# Patient Record
Sex: Female | Born: 1935 | Race: White | Hispanic: No | Smoking: Never smoker
Health system: Southern US, Community
[De-identification: ages and names within clinical notes are randomized; demographics above are authoritative.]

## PROBLEM LIST (undated history)

## (undated) DIAGNOSIS — M1990 Unspecified osteoarthritis, unspecified site: Secondary | ICD-10-CM

## (undated) DIAGNOSIS — I10 Essential (primary) hypertension: Secondary | ICD-10-CM

## (undated) DIAGNOSIS — E039 Hypothyroidism, unspecified: Secondary | ICD-10-CM

## (undated) DIAGNOSIS — E7800 Pure hypercholesterolemia, unspecified: Secondary | ICD-10-CM

## (undated) DIAGNOSIS — C50919 Malignant neoplasm of unspecified site of unspecified female breast: Secondary | ICD-10-CM

## (undated) DIAGNOSIS — I4891 Unspecified atrial fibrillation: Secondary | ICD-10-CM

## (undated) HISTORY — PX: OTHER SURGICAL HISTORY: SHX169

## (undated) HISTORY — PX: BREAST SURGERY: SHX581

## (undated) HISTORY — PX: ABDOMINAL HYSTERECTOMY: SHX81

---

## 1980-07-14 DIAGNOSIS — C50919 Malignant neoplasm of unspecified site of unspecified female breast: Secondary | ICD-10-CM

## 1980-07-14 HISTORY — PX: OTHER SURGICAL HISTORY: SHX169

## 1980-07-14 HISTORY — DX: Malignant neoplasm of unspecified site of unspecified female breast: C50.919

## 2012-06-06 ENCOUNTER — Observation Stay (HOSPITAL_COMMUNITY)
Admission: EM | Admit: 2012-06-06 | Discharge: 2012-06-07 | Disposition: A | Discharge status: Home / Self Care | Payer: Medicare Other | Attending: Internal Medicine | Admitting: Internal Medicine

## 2012-06-06 ENCOUNTER — Emergency Department (HOSPITAL_COMMUNITY): Payer: Medicare Other

## 2012-06-06 ENCOUNTER — Encounter (HOSPITAL_COMMUNITY): Payer: Self-pay

## 2012-06-06 DIAGNOSIS — R05 Cough: Secondary | ICD-10-CM | POA: Insufficient documentation

## 2012-06-06 DIAGNOSIS — J189 Pneumonia, unspecified organism: Principal | ICD-10-CM | POA: Diagnosis present

## 2012-06-06 DIAGNOSIS — E860 Dehydration: Secondary | ICD-10-CM | POA: Diagnosis present

## 2012-06-06 DIAGNOSIS — Z23 Encounter for immunization: Secondary | ICD-10-CM | POA: Insufficient documentation

## 2012-06-06 DIAGNOSIS — E039 Hypothyroidism, unspecified: Secondary | ICD-10-CM | POA: Diagnosis present

## 2012-06-06 DIAGNOSIS — D649 Anemia, unspecified: Secondary | ICD-10-CM | POA: Diagnosis present

## 2012-06-06 DIAGNOSIS — R0602 Shortness of breath: Secondary | ICD-10-CM | POA: Insufficient documentation

## 2012-06-06 DIAGNOSIS — R079 Chest pain, unspecified: Secondary | ICD-10-CM | POA: Insufficient documentation

## 2012-06-06 DIAGNOSIS — I10 Essential (primary) hypertension: Secondary | ICD-10-CM | POA: Diagnosis not present

## 2012-06-06 DIAGNOSIS — D509 Iron deficiency anemia, unspecified: Secondary | ICD-10-CM | POA: Insufficient documentation

## 2012-06-06 DIAGNOSIS — J9801 Acute bronchospasm: Secondary | ICD-10-CM | POA: Insufficient documentation

## 2012-06-06 DIAGNOSIS — R059 Cough, unspecified: Secondary | ICD-10-CM | POA: Insufficient documentation

## 2012-06-06 DIAGNOSIS — R509 Fever, unspecified: Secondary | ICD-10-CM | POA: Insufficient documentation

## 2012-06-06 HISTORY — DX: Essential (primary) hypertension: I10

## 2012-06-06 HISTORY — DX: Hypothyroidism, unspecified: E03.9

## 2012-06-06 HISTORY — DX: Pure hypercholesterolemia, unspecified: E78.00

## 2012-06-06 HISTORY — DX: Unspecified osteoarthritis, unspecified site: M19.90

## 2012-06-06 HISTORY — DX: Malignant neoplasm of unspecified site of unspecified female breast: C50.919

## 2012-06-06 LAB — CBC WITH DIFFERENTIAL/PLATELET
Basophils Absolute: 0 10*3/uL (ref 0.0–0.1)
Eosinophils Relative: 0 % (ref 0–5)
Lymphocytes Relative: 10 % — ABNORMAL LOW (ref 12–46)
Lymphs Abs: 0.6 10*3/uL — ABNORMAL LOW (ref 0.7–4.0)
MCV: 90.9 fL (ref 78.0–100.0)
Neutrophils Relative %: 79 % — ABNORMAL HIGH (ref 43–77)
Platelets: 166 10*3/uL (ref 150–400)
RBC: 3.08 MIL/uL — ABNORMAL LOW (ref 3.87–5.11)
RDW: 15 % (ref 11.5–15.5)
WBC: 5.7 10*3/uL (ref 4.0–10.5)

## 2012-06-06 LAB — BASIC METABOLIC PANEL
Calcium: 9.5 mg/dL (ref 8.4–10.5)
Creatinine, Ser: 0.81 mg/dL (ref 0.50–1.10)
GFR calc Af Amer: 74 mL/min — ABNORMAL LOW (ref >90)
GFR calc non Af Amer: 64 mL/min — ABNORMAL LOW (ref >90)
Sodium: 134 mEq/L — ABNORMAL LOW (ref 135–145)

## 2012-06-06 LAB — MRSA PCR SCREENING: MRSA by PCR: NEGATIVE

## 2012-06-06 LAB — FERRITIN: Ferritin: 34 ng/mL (ref 10–291)

## 2012-06-06 LAB — IRON AND TIBC: TIBC: 291 ug/dL (ref 250–470)

## 2012-06-06 MED ORDER — BENZONATATE 100 MG PO CAPS
100.0000 mg | ORAL_CAPSULE | Freq: Three times a day (TID) | ORAL | Status: DC | PRN
  Administered 2012-06-07: 100 mg via ORAL
  Filled 2012-06-06: qty 1

## 2012-06-06 MED ORDER — ACETAMINOPHEN 325 MG PO TABS: 650.0000 mg | ORAL_TABLET | Freq: Four times a day (QID) | ORAL | Status: DC | PRN

## 2012-06-06 MED ORDER — SODIUM CHLORIDE 0.9 % IV BOLUS (SEPSIS): 1000.0000 mL | Freq: Once | INTRAVENOUS | Status: DC

## 2012-06-06 MED ORDER — SODIUM CHLORIDE 0.9 % IV BOLUS (SEPSIS)
500.0000 mL | Freq: Once | INTRAVENOUS | Status: AC
  Administered 2012-06-06: 500 mL via INTRAVENOUS

## 2012-06-06 MED ORDER — ACETAMINOPHEN 500 MG PO TABS
1000.0000 mg | ORAL_TABLET | Freq: Every day | ORAL | Status: DC
  Administered 2012-06-06: 1000 mg via ORAL
  Filled 2012-06-06: qty 2

## 2012-06-06 MED ORDER — DEXTROSE 5 % IV SOLN
1.0000 g | Freq: Once | INTRAVENOUS | Status: AC
  Administered 2012-06-06: 1 g via INTRAVENOUS
  Filled 2012-06-06: qty 10

## 2012-06-06 MED ORDER — LEVALBUTEROL HCL 0.63 MG/3ML IN NEBU: 0.6300 mg | INHALATION_SOLUTION | RESPIRATORY_TRACT | Status: DC | PRN

## 2012-06-06 MED ORDER — ACETAMINOPHEN 650 MG RE SUPP: 650.0000 mg | Freq: Four times a day (QID) | RECTAL | Status: DC | PRN

## 2012-06-06 MED ORDER — HYDROCODONE-ACETAMINOPHEN 5-325 MG PO TABS
1.0000 | ORAL_TABLET | ORAL | Status: DC | PRN
  Administered 2012-06-07: 1 via ORAL
  Filled 2012-06-06: qty 1

## 2012-06-06 MED ORDER — ONDANSETRON HCL 4 MG/2ML IJ SOLN: 4.0000 mg | Freq: Four times a day (QID) | INTRAMUSCULAR | Status: DC | PRN

## 2012-06-06 MED ORDER — POTASSIUM CHLORIDE CRYS ER 20 MEQ PO TBCR
20.0000 meq | EXTENDED_RELEASE_TABLET | Freq: Every day | ORAL | Status: AC
  Administered 2012-06-06: 20 meq via ORAL
  Filled 2012-06-06: qty 1

## 2012-06-06 MED ORDER — POTASSIUM CHLORIDE IN NACL 20-0.9 MEQ/L-% IV SOLN
INTRAVENOUS | Status: DC
  Administered 2012-06-06 – 2012-06-07 (×2): via INTRAVENOUS

## 2012-06-06 MED ORDER — GUAIFENESIN-DM 100-10 MG/5ML PO SYRP
5.0000 mL | ORAL_SOLUTION | ORAL | Status: DC | PRN
  Administered 2012-06-07: 5 mL via ORAL
  Filled 2012-06-06: qty 5

## 2012-06-06 MED ORDER — LEVOFLOXACIN IN D5W 500 MG/100ML IV SOLN
500.0000 mg | INTRAVENOUS | Status: DC
  Administered 2012-06-07: 500 mg via INTRAVENOUS
  Filled 2012-06-06 (×3): qty 100

## 2012-06-06 MED ORDER — ONDANSETRON HCL 4 MG PO TABS: 4.0000 mg | ORAL_TABLET | Freq: Four times a day (QID) | ORAL | Status: DC | PRN

## 2012-06-06 MED ORDER — DIPHENHYDRAMINE HCL 25 MG PO CAPS
25.0000 mg | ORAL_CAPSULE | Freq: Every day | ORAL | Status: DC
  Administered 2012-06-06: 25 mg via ORAL
  Filled 2012-06-06: qty 1

## 2012-06-06 MED ORDER — AZITHROMYCIN 500 MG IV SOLR
500.0000 mg | INTRAVENOUS | Status: DC
  Administered 2012-06-06: 500 mg via INTRAVENOUS
  Filled 2012-06-06: qty 500

## 2012-06-06 MED ORDER — PNEUMOCOCCAL VAC POLYVALENT 25 MCG/0.5ML IJ INJ
0.5000 mL | INJECTION | INTRAMUSCULAR | Status: AC
  Administered 2012-06-07: 0.5 mL via INTRAMUSCULAR
  Filled 2012-06-06: qty 0.5

## 2012-06-06 NOTE — ED Provider Notes (Signed)
History   This chart was scribed for Doug Sou, MD by Charolett Bumpers, ER Scribe. The patient was seen in room APA12/APA12. Patient's care was started at 1345.   CSN: [id number]  Arrival date & time 06/06/12  1321   First MD Initiated Contact with Patient 06/06/12 1345      Chief Complaint  Patient presents with  . Shortness of Breath   Raven Moore is a 78 y.o. female who presents to the Emergency Department complaining of SOB with an associated cough, chest pain and fever that started 2 days ago. She states her chest pain is worse with deep breaths. She reports she had a fever of 101 two days ago and a fever of 100 today. Temperature here in ED is 98.4. She denies any abdominal pain, diarrhea or vomiting. She states that she has a surgical h/o 2 hip replacements, hysterectomy, breast removal. She denies smoking or alcohol use. She reports her last hospitalization was around 2002.   Patient is a 78 y.o. female presenting with shortness of breath. The history is provided by the patient. No language interpreter was used.  Shortness of Breath  The current episode started 2 days ago. The onset was gradual. The problem has been unchanged. The problem is mild. Nothing relieves the symptoms. Nothing aggravates the symptoms. Associated symptoms include a fever, cough and shortness of breath.   PCP: Dr. Jarold Motto in [place].  Past Medical History  Diagnosis Date  . Hypertension   . Thyroid disease   . High cholesterol     Past Surgical History  Procedure Date  . Abdominal hysterectomy   . Hip pinning   . Mastectomy     No family history on file.  History  Substance Use Topics  . Smoking status: Not on file  . Smokeless tobacco: Not on file  . Alcohol Use: No    OB History    Grav Para Term Preterm Abortions TAB SAB Ect Mult Living                  Review of Systems  Constitutional: Positive for fever.  HENT: Negative.   Respiratory: Positive for cough and  shortness of breath.        Pleuritic chest pain  Cardiovascular: Negative.   Gastrointestinal: Negative.   Musculoskeletal: Negative.   Skin: Negative.   Neurological: Negative.   Hematological: Negative.   Psychiatric/Behavioral: Negative.   All other systems reviewed and are negative.    Allergies  Review of patient's allergies indicates no known allergies.  Home Medications  No current outpatient prescriptions on file.  BP 113/77  Pulse 87  Temp 98.4 F (36.9 C) (Oral)  Resp 18  Ht 5\' 4"  (1.626 m)  Wt 120 lb (54.432 kg)  BMI 20.60 kg/m2  SpO2 94%  LMP 06/06/2012  Physical Exam  Nursing note and vitals reviewed. Constitutional: She is oriented to person, place, and time. She appears well-developed and well-nourished.  HENT:  Head: Normocephalic and atraumatic.  Eyes: Conjunctivae normal are normal. Pupils are equal, round, and reactive to light.  Neck: Neck supple. No tracheal deviation present. No thyromegaly present.  Cardiovascular: Normal rate and regular rhythm.   No murmur heard. Pulmonary/Chest: Effort normal. No respiratory distress. She has rales.       Diffuse scant rales. Speaks in paragraphs  Abdominal: Soft. Bowel sounds are normal. She exhibits no distension. There is no tenderness.  Musculoskeletal: Normal range of motion. She exhibits no edema and no  tenderness.  Neurological: She is alert and oriented to person, place, and time. Coordination normal.  Skin: Skin is warm and dry. No rash noted.  Psychiatric: She has a normal mood and affect.    ED Course  Procedures (including critical care time)  DIAGNOSTIC STUDIES: Oxygen Saturation is 94% on room air, adequate by my interpretation.    COORDINATION OF CARE:  13:55-Discussed planned course of treatment with the patient including a chest x-ray, who is agreeable at this time.   Results for orders placed during the hospital encounter of 06/06/12  BASIC METABOLIC PANEL      Component Value  Range   Sodium 134 (*) 135 - 145 mEq/L   Potassium 3.5  3.5 - 5.1 mEq/L   Chloride 100  96 - 112 mEq/L   CO2 24  19 - 32 mEq/L   Glucose, Bld 105 (*) 70 - 99 mg/dL   BUN 28 (*) 6 - 23 mg/dL   Creatinine, Ser 1.61  0.50 - 1.10 mg/dL   Calcium 9.5  8.4 - 09.6 mg/dL   GFR calc non Af Amer 64 (*) >90 mL/min   GFR calc Af Amer 74 (*) >90 mL/min  CBC WITH DIFFERENTIAL      Component Value Range   WBC 5.7  4.0 - 10.5 K/uL   RBC 3.08 (*) 3.87 - 5.11 MIL/uL   Hemoglobin 9.3 (*) 12.0 - 15.0 g/dL   HCT 04.5 (*) 40.9 - 81.1 %   MCV 90.9  78.0 - 100.0 fL   MCH 30.2  26.0 - 34.0 pg   MCHC 33.2  30.0 - 36.0 g/dL   RDW 91.4  78.2 - 95.6 %   Platelets 166  150 - 400 K/uL   Neutrophils Relative 79 (*) 43 - 77 %   Neutro Abs 4.5  1.7 - 7.7 K/uL   Lymphocytes Relative 10 (*) 12 - 46 %   Lymphs Abs 0.6 (*) 0.7 - 4.0 K/uL   Monocytes Relative 11  3 - 12 %   Monocytes Absolute 0.6  0.1 - 1.0 K/uL   Eosinophils Relative 0  0 - 5 %   Eosinophils Absolute 0.0  0.0 - 0.7 K/uL   Basophils Relative 0  0 - 1 %   Basophils Absolute 0.0  0.0 - 0.1 K/uL    Dg Chest 2 View  06/06/2012  *RADIOLOGY REPORT*  Clinical Data: Shortness of breath and wheezing.  CHEST - 2 VIEW  Comparison: None.  Findings: There is an infiltrate in the left lower lobe with associated small left pleural effusion.  Underlying COPD present. No pulmonary edema.  Heart size is normal.  IMPRESSION: Left lower lobe infiltrate with associated small left pleural effusion.   Original Report Authenticated By: Irish Lack, M.D.      No diagnosis found.  X-ray reviewed by me MDM  Spoke with Dr. Sherrie Mustache plan 23 hour observation MedSurg floor Diagnosis #1 community-acquired pneumonia #2 anemia     I personally performed the services described in this documentation, which was scribed in my presence. The recorded information has been reviewed and is accurate.      Doug Sou, MD 06/06/12 1535

## 2012-06-06 NOTE — ED Notes (Signed)
Complain of being SOB. Denies other symptoms

## 2012-06-06 NOTE — H&P (Signed)
[facility] History and Physical  Raven Goody Dunaway BJY:[id number] DOB: 04/19/1924 DOA: 06/06/2012  Referring physician: Dr. Rennis Chris PCP: Nurse practitioner Ms. Jarold Motto    Chief Complaint: Shortness of breath  HPI: Raven Moore is a 78 y.o. female with a history significant for hypertension, hypothyroidism, and hyperlipidemia, who presents to the emergency department today with a chief complaint of shortness of breath. She started feeling "lousy" 2 weeks ago. Over the past couple days, she developed a fever of 101.21F, shortness of breath with little activity, occasional wheezing, and a dry hacking cough. She also has some central chest discomfort with deep inspiration and with coughing. She denies nausea, vomiting, diarrhea, abdominal pain, dizziness, swelling in her legs, or pain with urination.  In the emergency department, she is afebrile and hemodynamically stable. She is oxygenating 94% on room air. Her chest x-ray reveals left lower lobe infiltrate and small pleural effusion. Her lab data are significant for a hemoglobin of 9.3, sodium of 134, and BUN of 28. She is being admitted for evaluation and management.  Review of Systems: As above in history present illness. In addition, she has chronic low back pain. She denies black tarry stools or bright red blood per rectum. Otherwise, review of systems is negative.  Past Medical History  Diagnosis Date  . Hypertension   . High cholesterol   . Hypothyroidism 06/06/2012  . Breast cancer 1982    On the right.  . DJD (degenerative joint disease)    Past Surgical History  Procedure Date  . Abdominal hysterectomy     For fibroid tumors.  . Bilateral total hip replacements   . Right-sided mastectomy 1982   Social History: She is widowed. She has one daughter, Lurena Joiner. Lurena Joiner lives with her. She is retired. She denies tobacco, alcohol, and illicit drug use. She still drives.  Allergies  Allergen Reactions  . Novocain (Procaine  Hcl)    Family history: Her mother died of breast cancer. Etiology of her father's death is unknown.  Prior to Admission medications   Not on File  The patient does not recall all the names of her medications neither does she recall the doses. The medications will be obtained and reviewed tomorrow morning.    Physical Exam: Filed Vitals:   06/06/12 1333 06/06/12 1339  BP: 113/77   Pulse: 87   Temp: 98.4 F (36.9 C)   TempSrc: Oral   Resp: 18   Height:  5\' 4"  (1.626 m)  Weight: 54.432 kg (120 lb) 54.432 kg (120 lb)  SpO2: 94%      General:  Pleasant, alert 78 year old Caucasian woman sitting up in bed, in no acute distress.  Eyes: Pupils are equal, round, and reactive to light. Extraocular movements are intact. Conjunctivae are clear. Sclerae are white.  ENT: Oropharynx reveals mildly dry mucous membranes. No posterior exudates or erythema.  Neck: Supple, no adenopathy, no thyromegaly, no JVD.  Cardiovascular: S1, S2, with no murmurs rubs or gallops.  Respiratory: Rare crackles left lower base, otherwise clear. Breathing nonlabored.  Abdomen: Positive bowel sounds, soft, nontender, nondistended.  Skin: Fair turgor. Small excoriation on the right lateral leg with a small amount of erythema, no drainage, no tenderness (from accidental scraping of leg on the car door).  Musculoskeletal: No acute hot red joints. Pedal pulses palpable. No pedal edema.  Psychiatric: Pleasant affect. Alert. Oriented. Cooperative.  Neurologic: Cranial nerves II through XII are intact. Strength is 5 over 5 throughout. Sensation is intact.  Labs on Admission:  Basic Metabolic Panel:  Lab 06/06/12 2130  NA 134*  K 3.5  CL 100  CO2 24  GLUCOSE 105*  BUN 28*  CREATININE 0.81  CALCIUM 9.5  MG --  PHOS --   Liver Function Tests: No results found for this basename: AST:5,ALT:5,ALKPHOS:5,BILITOT:5,PROT:5,ALBUMIN:5 in the last 168 hours No results found for this basename:  LIPASE:5,AMYLASE:5 in the last 168 hours No results found for this basename: AMMONIA:5 in the last 168 hours CBC:  Lab 06/06/12 1353  WBC 5.7  NEUTROABS 4.5  HGB 9.3*  HCT 28.0*  MCV 90.9  PLT 166   Cardiac Enzymes: No results found for this basename: CKTOTAL:5,CKMB:5,CKMBINDEX:5,TROPONINI:5 in the last 168 hours  BNP (last 3 results) No results found for this basename: PROBNP:3 in the last 8760 hours CBG: No results found for this basename: GLUCAP:5 in the last 168 hours  Radiological Exams on Admission: Dg Chest 2 View  06/06/2012  *RADIOLOGY REPORT*  Clinical Data: Shortness of breath and wheezing.  CHEST - 2 VIEW  Comparison: None.  Findings: There is an infiltrate in the left lower lobe with associated small left pleural effusion.  Underlying COPD present. No pulmonary edema.  Heart size is normal.  IMPRESSION: Left lower lobe infiltrate with associated small left pleural effusion.   Original Report Authenticated By: Irish Lack, M.D.       Assessment/Plan Principal Problem:  *CAP (community acquired pneumonia) Active Problems:  Anemia  Dehydration  HTN (hypertension)  Hypothyroidism   1. The patient is an 78 year old woman who presents with symptomatology of community acquired pneumonia. She is afebrile here, but she was febrile at home. She has had generalized malaise, cough, fever, shortness of breath, and occasional wheezing. She does not appear to be in extremis, but it is reasonable to admit her for 24-hour observation for intravenous antibiotics. She is also noted to be mildly dehydrated with an elevated BUN and slightly low serum sodium. She is also noted to be anemic. She was unaware of this. She has no history of black tarry stools or bright red blood per rectum. She had a colonoscopy approximately 13 years ago and was  told that there was nothing seriously found.      Plan: 1. We'll admit the patient for 24-hour observation. 2. The patient received  Rocephin and azithromycin in the emergency department. We'll continue antibiotic treatment with intravenous Levaquin starting tomorrow. 3. Gentle IV fluid hydration. 4. Symptomatic treatment with as needed Tessalon Perles, as needed Xopenex nebulizer, and as needed Robitussin. 5. We'll order a few anemia studies.       Code Status: Full code. Family Communication: Discussed with daughter. Disposition Plan: Probable discharge tomorrow.  Time spent: One hour.  Kolbie Clarkston [facility] Pager: [phone].  If 7PM-7AM, please contact night-coverage [email or web address] Password [email or web address] 06/06/2012, 4:16 PM

## 2012-06-06 NOTE — ED Notes (Signed)
Pt placed on 2L of [place] O2

## 2012-06-06 NOTE — ED Notes (Signed)
Dr. Sherrie Mustache in room to assess pt

## 2012-06-07 ENCOUNTER — Encounter (HOSPITAL_COMMUNITY): Payer: Self-pay | Admitting: *Deleted

## 2012-06-07 LAB — COMPREHENSIVE METABOLIC PANEL
ALT: 22 U/L (ref 0–35)
Alkaline Phosphatase: 38 U/L — ABNORMAL LOW (ref 39–117)
CO2: 27 mEq/L (ref 19–32)
Chloride: 106 mEq/L (ref 96–112)
GFR calc Af Amer: 69 mL/min — ABNORMAL LOW (ref >90)
Glucose, Bld: 83 mg/dL (ref 70–99)
Potassium: 4.3 mEq/L (ref 3.5–5.1)
Sodium: 139 mEq/L (ref 135–145)
Total Bilirubin: 0.3 mg/dL (ref 0.3–1.2)
Total Protein: 5.5 g/dL — ABNORMAL LOW (ref 6.0–8.3)

## 2012-06-07 LAB — CBC
HCT: 27.1 % — ABNORMAL LOW (ref 36.0–46.0)
MCHC: 32.5 g/dL (ref 30.0–36.0)
Platelets: 175 10*3/uL (ref 150–400)
RDW: 15.2 % (ref 11.5–15.5)

## 2012-06-07 MED ORDER — LEVALBUTEROL TARTRATE 45 MCG/ACT IN AERO
1.0000 | INHALATION_SPRAY | Freq: Four times a day (QID) | RESPIRATORY_TRACT | Status: DC
  Administered 2012-06-07: 1 via RESPIRATORY_TRACT
  Filled 2012-06-07: qty 15

## 2012-06-07 MED ORDER — BENZONATATE 100 MG PO CAPS: 100.0000 mg | ORAL_CAPSULE | Freq: Three times a day (TID) | ORAL | Status: DC | PRN

## 2012-06-07 MED ORDER — LEVALBUTEROL TARTRATE 45 MCG/ACT IN AERO: 1.0000 | INHALATION_SPRAY | Freq: Four times a day (QID) | RESPIRATORY_TRACT | Status: DC | PRN

## 2012-06-07 MED ORDER — FERROUS SULFATE 325 (65 FE) MG PO TABS: 325.0000 mg | ORAL_TABLET | Freq: Every day | ORAL | Status: DC

## 2012-06-07 MED ORDER — LEVOFLOXACIN 500 MG PO TABS: 500.0000 mg | ORAL_TABLET | Freq: Every day | ORAL | Status: DC

## 2012-06-07 NOTE — Discharge Summary (Signed)
Physician Discharge Summary  Raven Moore ZOX:[id number] DOB: Mar 20, 1924 DOA: 06/06/2012  PCP: Ninfa Linden, FNP  Admit date: 06/06/2012 Discharge date: 06/07/2012  Time spent: Greater than 30 minutes  Recommendations for Outpatient Follow-up:  1. The patient will followup with family nurse practitioner, Ninfa Linden has recommended in 1 week. 2. Further discussion of treatment and evaluation of anemia recommended at the followup appointment.  Discharge Diagnoses:  1. Community-acquired pneumonia. 2. Mild bronchospasms secondary to #1. 3. Mild dehydration. 4. Iron deficiency anemia. Anemia panel revealed a total iron of 11, TIBC of 291, ferritin of 34, and vitamin B12 of 539. Her hemoglobin was 8.8 at the time of discharge. 5. Hypothyroidism. 6. Hypertension.  Discharge Condition: Improved and stable.  Diet recommendation: Heart healthy.  Filed Weights   06/06/12 1333 06/06/12 1339 06/06/12 1651  Weight: 54.432 kg (120 lb) 54.432 kg (120 lb) 57 kg (125 lb 10.6 oz)    History of present illness:  The patient is a pleasant 78 are old woman with a history of hypertension, hypothyroidism, and hyperlipidemia, who presented to the emergency department on 06/06/2012 with a chief complaint of shortness of breath. She also had occasional wheezing and a fever of 101.72F at home. In the emergency department, she was afebrile and hemodynamically stable. She was oxygenating 94% on room air. Her chest x-ray revealed a left lower lobe infiltrate and small pleural effusion. Her lab data were significant for hemoglobin of 9.3, sodium of 134, and BUN of 28. She was admitted for further evaluation and management.  Hospital Course:  She was started on intravenous Rocephin and azithromycin in the emergency department. She was subsequently continued on antibiotic treatment with intravenous Levaquin. Gentle IV fluids were ordered for hydration as it appeared that she was volume depleted or  dehydrated. Tessalon Perles were given as needed for cough. Xopenex nebulizer was ordered every 8 hours as needed for wheezing. Her home medications were withheld initially, but were restarted upon discharge. For further evaluation of her anemia, and anemia panel was ordered. The results were dictated above. It appeared that she has iron deficiency anemia. Her review of systems was negative for black tarry stools or bright red blood per rectum. She had a colonoscopy over 13 years ago, and does not recall any serious results from it. Ferrous sulfate was prescribed once daily. Omeprazole was restarted upon discharge. I discussed the possibility of further evaluation with the patient and her daughter, primarily with an upper endoscopy and/or colonoscopy. However, given her age and the lack of black tarry stools or bright red blood per rectum I recommended that they discuss the risk/benefits of these endoscopic studies with her primary care provider. I recommended conservative management with monitoring of her hemoglobin and adjusting iron supplementation accordingly rather than investigation with invasive studies. But, ultimately the decision would be the patient's. They voiced understanding.  The patient improved clinically and symptomatically. She received the pneumococcal vaccine. She was discharged to home on 6 more days of oral Levaquin, as needed Xopenex inhaler, and Tessalon Perles for cough.  Procedures:  None  Consultations:  None  Discharge Exam: Filed Vitals:   06/06/12 1651 06/06/12 1713 06/06/12 2015 06/07/12 0730  BP: 140/81  131/74   Pulse: 65 66 56   Temp: 98.2 F (36.8 C)  98.4 F (36.9 C) 98.4 F (36.9 C)  TempSrc: Oral  Oral Oral  Resp: 18 20 20    Height: 5\' 4"  (1.626 m)     Weight: 57 kg (125 lb  10.6 oz)     SpO2: 96% 98% 96%     General: Pleasant 78 year old Caucasian woman sitting up in bed, in no acute distress. Cardiovascular: S1, S2, with no murmurs rubs or  gallops. Respiratory: Very faint expiratory wheezes, no crackles. Breathing nonlabored.  Discharge Instructions      Discharge Orders    Future Orders Please Complete By Expires   Diet - low sodium heart healthy      Increase activity slowly      Discharge instructions      Comments:   Discuss further evaluation of your iron deficiency anemia with your primary care physician. Take the iron supplement once daily with breakfast. It can be titrated up to 1 iron supplement 2-3 times a day, but discuss this with your doctor first.       Medication List     As of 06/07/2012  6:22 PM    TAKE these medications         ALEVE 220 MG tablet   Generic drug: naproxen sodium   Take 220 mg by mouth daily as needed. Pain.      benzonatate 100 MG capsule   Commonly known as: TESSALON   Take 1 capsule (100 mg total) by mouth 3 (three) times daily as needed for cough.      enalapril 5 MG tablet   Commonly known as: VASOTEC   Take 5 mg by mouth daily.      ferrous sulfate 325 (65 FE) MG tablet   Take 1 tablet (325 mg total) by mouth daily with breakfast.      fish oil-omega-3 fatty acids 1000 MG capsule   Take 1 g by mouth daily.      hydrochlorothiazide 25 MG tablet   Commonly known as: HYDRODIURIL   Take 25 mg by mouth daily.      levalbuterol 45 MCG/ACT inhaler   Commonly known as: XOPENEX HFA   Inhale 1 puff into the lungs every 6 (six) hours as needed for wheezing or shortness of breath.      levofloxacin 500 MG tablet   Commonly known as: LEVAQUIN   Take 1 tablet (500 mg total) by mouth daily. Antibiotic. Take as prescribed starting tomorrow.      levothyroxine 75 MCG tablet   Commonly known as: SYNTHROID, LEVOTHROID   Take 75 mcg by mouth daily.      lovastatin 20 MG tablet   Commonly known as: MEVACOR   Take 20 mg by mouth at bedtime.      multivitamin with minerals Tabs   Take 1 tablet by mouth daily.      omeprazole 20 MG capsule   Commonly known as: PRILOSEC    Take 20 mg by mouth daily.      PARoxetine 10 MG tablet   Commonly known as: PAXIL   Take 10 mg by mouth every morning.        Follow-up Information    Schedule an appointment as soon as possible for a visit with Ninfa Linden, FNP.   Contact information:   [address] [place] [address] [phone]           The results of significant diagnostics from this hospitalization (including imaging, microbiology, ancillary and laboratory) are listed below for reference.    Significant Diagnostic Studies: Dg Chest 2 View  06/06/2012  *RADIOLOGY REPORT*  Clinical Data: Shortness of breath and wheezing.  CHEST - 2 VIEW  Comparison: None.  Findings: There  is an infiltrate in the left lower lobe with associated small left pleural effusion.  Underlying COPD present. No pulmonary edema.  Heart size is normal.  IMPRESSION: Left lower lobe infiltrate with associated small left pleural effusion.   Original Report Authenticated By: Irish Lack, M.D.     Microbiology: Recent Results (from the past 240 hour(s))  MRSA PCR SCREENING     Status: Normal   Collection Time   06/06/12  5:10 PM      Component Value Range Status Comment   MRSA by PCR NEGATIVE  NEGATIVE Final      Labs: Basic Metabolic Panel:  Lab 06/07/12 4540 06/06/12 1353  NA 139 134*  K 4.3 3.5  CL 106 100  CO2 27 24  GLUCOSE 83 105*  BUN 22 28*  CREATININE 0.86 0.81  CALCIUM 8.9 9.5  MG -- --  PHOS -- --   Liver Function Tests:  Lab 06/07/12 0443  AST 24  ALT 22  ALKPHOS 38*  BILITOT 0.3  PROT 5.5*  ALBUMIN 2.4*   No results found for this basename: LIPASE:5,AMYLASE:5 in the last 168 hours No results found for this basename: AMMONIA:5 in the last 168 hours CBC:  Lab 06/07/12 0443 06/06/12 1353  WBC 5.0 5.7  NEUTROABS -- 4.5  HGB 8.8* 9.3*  HCT 27.1* 28.0*  MCV 92.5 90.9  PLT 175 166   Cardiac Enzymes: No results found for this basename:  CKTOTAL:5,CKMB:5,CKMBINDEX:5,TROPONINI:5 in the last 168 hours BNP: BNP (last 3 results) No results found for this basename: PROBNP:3 in the last 8760 hours CBG: No results found for this basename: GLUCAP:5 in the last 168 hours     Signed:  Colter Magowan  [facility] 06/07/2012, 6:22 PM

## 2012-06-07 NOTE — Plan of Care (Signed)
Problem: Consults Goal: Pneumonia Patient Education See Patient Educatio Module for education specifics.  Outcome: Progressing Discuss with patient need to cough and deep breath to improve breathing Goal: Skin Care Protocol Initiated - if Braden Score 18 or less If consults are not indicated, leave blank or document N/A  Outcome: Progressing Wound to Right lateral leg scabbing  Problem: Phase I Progression Outcomes Goal: Pain controlled with appropriate interventions Outcome: Progressing Pleuritic pain during cough Goal: Initial discharge plan identified Outcome: Progressing Will go home to [facility] with daughter at discharge for the holidays

## 2012-06-07 NOTE — Progress Notes (Signed)
UR Chart Review Completed  

## 2012-06-07 NOTE — Progress Notes (Signed)
Discharge instructions given to patient with scripts.  Patient and daughter verbalized understanding of discharge instructions.  No acute distress noted.  Patient ready for discharge.

## 2012-06-07 NOTE — Progress Notes (Signed)
Patient out via wheelchair to awaiting vehicle for transport home.  No acute distress noted.

## 2015-11-03 ENCOUNTER — Emergency Department (HOSPITAL_COMMUNITY): Payer: Medicare Other

## 2015-11-03 ENCOUNTER — Encounter (HOSPITAL_COMMUNITY): Payer: Self-pay | Admitting: *Deleted

## 2015-11-03 ENCOUNTER — Emergency Department (HOSPITAL_COMMUNITY)
Admission: EM | Admit: 2015-11-03 | Discharge: 2015-11-03 | Disposition: A | Discharge status: Home / Self Care | Payer: Medicare Other | Attending: Emergency Medicine | Admitting: Emergency Medicine

## 2015-11-03 DIAGNOSIS — E039 Hypothyroidism, unspecified: Secondary | ICD-10-CM | POA: Insufficient documentation

## 2015-11-03 DIAGNOSIS — Z79899 Other long term (current) drug therapy: Secondary | ICD-10-CM | POA: Diagnosis not present

## 2015-11-03 DIAGNOSIS — M25531 Pain in right wrist: Secondary | ICD-10-CM | POA: Diagnosis present

## 2015-11-03 DIAGNOSIS — W010XXA Fall on same level from slipping, tripping and stumbling without subsequent striking against object, initial encounter: Secondary | ICD-10-CM | POA: Diagnosis not present

## 2015-11-03 DIAGNOSIS — M25551 Pain in right hip: Secondary | ICD-10-CM | POA: Diagnosis not present

## 2015-11-03 DIAGNOSIS — Y939 Activity, unspecified: Secondary | ICD-10-CM | POA: Insufficient documentation

## 2015-11-03 DIAGNOSIS — Z853 Personal history of malignant neoplasm of breast: Secondary | ICD-10-CM | POA: Insufficient documentation

## 2015-11-03 DIAGNOSIS — Y999 Unspecified external cause status: Secondary | ICD-10-CM | POA: Insufficient documentation

## 2015-11-03 DIAGNOSIS — I4891 Unspecified atrial fibrillation: Secondary | ICD-10-CM | POA: Insufficient documentation

## 2015-11-03 DIAGNOSIS — Y929 Unspecified place or not applicable: Secondary | ICD-10-CM | POA: Insufficient documentation

## 2015-11-03 DIAGNOSIS — W19XXXA Unspecified fall, initial encounter: Secondary | ICD-10-CM

## 2015-11-03 DIAGNOSIS — I10 Essential (primary) hypertension: Secondary | ICD-10-CM | POA: Insufficient documentation

## 2015-11-03 HISTORY — DX: Unspecified atrial fibrillation: I48.91

## 2015-11-03 NOTE — ED Notes (Addendum)
Pt reports right wrist pain and right hip pain. Pt is mostly concerned about her wrist. Pt states she fell earlier and caught herself with her right hand.  Pt and family states they are not here concerning the right hip pain they are just here for her wrist. Family states she just "bruised" her buttocks.

## 2015-11-03 NOTE — Discharge Instructions (Signed)
Take over the counter tylenol, as directed on packaging, as needed for discomfort.  Apply moist heat or ice to the area(s) of discomfort, for 15 minutes at a time, several times per day for the next few days.  Do not fall asleep on a heating or ice pack. Wear the splint until you are seen in follow up.  Call your regular Orthopedic doctor on Monday to schedule a follow up appointment in the next 3 to 4 days.  Return to the Emergency Department immediately if worsening.

## 2015-11-03 NOTE — ED Provider Notes (Signed)
CSN: [id number]     Arrival date & time 11/03/15  2059 History   First MD Initiated Contact with Patient 11/03/15 2127     Chief Complaint  Patient presents with  . Wrist Pain     HPI  Pt was seen at 2130. Per pt and her family, c/o sudden onset and resolution of one episode of slip and fall that occurred this morning. Pt states she fell onto her right hip and outstretched right arm. Pt c/o right hip and right wrist pain. Pt was able to stand after her fall. Pt has been ambulatory since falling. Denies hitting head, no LOC, no AMS, no neck or back pain, no focal motor weakness, no tingling/numbness in extremities.    Past Medical History  Diagnosis Date  . Hypertension   . High cholesterol   . Hypothyroidism 06/06/2012  . Breast cancer ([place]) 1982    On the right.  . DJD (degenerative joint disease)   . Atrial fibrillation [facility])    Past Surgical History  Procedure Laterality Date  . Abdominal hysterectomy      For fibroid tumors.  . Bilateral total hip replacements    . Right-sided mastectomy  1982    Social History  Substance Use Topics  . Smoking status: Never Smoker   . Smokeless tobacco: None  . Alcohol Use: No    Review of Systems ROS: Statement: All systems negative except as marked or noted in the HPI; Constitutional: Negative for fever and chills. ; ; Eyes: Negative for eye pain, redness and discharge. ; ; ENMT: Negative for ear pain, hoarseness, nasal congestion, sinus pressure and sore throat. ; ; Cardiovascular: Negative for chest pain, palpitations, diaphoresis, dyspnea and peripheral edema. ; ; Respiratory: Negative for cough, wheezing and stridor. ; ; Gastrointestinal: Negative for nausea, vomiting, diarrhea, abdominal pain, blood in stool, hematemesis, jaundice and rectal bleeding. . ; ; Genitourinary: Negative for dysuria, flank pain and hematuria. ; ; Musculoskeletal: +wrist pain, hip pain. Negative for back pain and neck pain. Negative for swelling and  deformity; ; Skin: Negative for pruritus, rash, abrasions, blisters, bruising and skin lesion.; ; Neuro: Negative for headache, lightheadedness and neck stiffness. Negative for weakness, altered level of consciousness , altered mental status, extremity weakness, paresthesias, involuntary movement, seizure and syncope.      Allergies  Novocain  Home Medications   Prior to Admission medications   Medication Sig Start Date End Date Taking? Authorizing Provider  apixaban (ELIQUIS) 2.5 MG TABS tablet Take 2.5 mg by mouth 2 (two) times daily.   Yes Historical Provider, MD  calcium citrate (CALCITRATE - DOSED IN MG ELEMENTAL CALCIUM) 950 MG tablet Take 200 mg of elemental calcium by mouth daily.   Yes Historical Provider, MD  carboxymethylcellulose (REFRESH PLUS) 0.5 % SOLN Place 1 drop into both eyes 3 (three) times daily as needed.   Yes Historical Provider, MD  enalapril (VASOTEC) 10 MG tablet Take 10 mg by mouth daily.   Yes Historical Provider, MD  fish oil-omega-3 fatty acids 1000 MG capsule Take 1 g by mouth daily.   Yes Historical Provider, MD  furosemide (LASIX) 40 MG tablet Take 40 mg by mouth daily.   Yes Historical Provider, MD  levothyroxine (SYNTHROID, LEVOTHROID) 75 MCG tablet Take 75 mcg by mouth daily.   Yes Historical Provider, MD  lovastatin (MEVACOR) 20 MG tablet Take 20 mg by mouth at bedtime.   Yes Historical Provider, MD  Multiple Vitamin (MULTIVITAMIN WITH MINERALS) TABS Take 1 tablet  by mouth daily.   Yes Historical Provider, MD  omeprazole (PRILOSEC) 20 MG capsule Take 20 mg by mouth daily.   Yes Historical Provider, MD  PARoxetine (PAXIL) 20 MG tablet Take 20 mg by mouth daily.   Yes Historical Provider, MD   BP 153/75 mmHg  Pulse 78  Temp(Src) 98.9 F (37.2 C) (Temporal)  Resp 18  Ht 5\' 5"  (1.651 m)  Wt 119 lb (53.978 kg)  BMI 19.80 kg/m2  SpO2 99%  LMP 06/06/2012 Physical Exam  2135: Physical examination:  Nursing notes reviewed; Vital signs and O2 SAT  reviewed;  Constitutional: Well developed, Well nourished, Well hydrated, In no acute distress; Head:  Normocephalic, atraumatic; Eyes: EOMI, PERRL, No scleral icterus; ENMT: Mouth and pharynx normal, Mucous membranes moist; Neck: Supple, Full range of motion, No lymphadenopathy; Cardiovascular: Regular rate and rhythm, No gallop; Respiratory: Breath sounds clear & equal bilaterally, No wheezes.  Speaking full sentences with ease, Normal respiratory effort/excursion; Chest: Nontender, Movement normal; Abdomen: Soft, Nontender, Nondistended, Normal bowel sounds; Genitourinary: No CVA tenderness; Spine:  No midline CS, TS, LS tenderness.;; Extremities: Pulses normal, Pelvis stable. +mild right hip tenderness to palp. NT right knee/ankle/foot. NT right shoulder/elbow. +right wrist with generalized TTP. +snuffbox tenderness. Forearm compartments soft, strong radial pp, brisk cap refill in fingers. Hand NMS intact.  Decreased ROM F/E right wrist d/t c/o pain. No deformity, no erythema, no ecchymosis, no edema..; Neuro: AA&Ox3, Major CN grossly intact.  Speech clear. No gross focal motor or sensory deficits in extremities.; Skin: Color normal, Warm, Dry.   ED Course  Procedures (including critical care time) Labs Review  Imaging Review  I have personally reviewed and evaluated these images and lab results as part of my medical decision-making.   EKG Interpretation None      MDM  MDM Reviewed: nursing note, vitals and previous chart Interpretation: x-ray     Dg Wrist Complete Right 11/03/2015  CLINICAL DATA:  Fall, right wrist pain EXAM: RIGHT WRIST - COMPLETE 3+ VIEW COMPARISON:  None. FINDINGS: No fracture or dislocation is seen. Moderate degenerative changes of the 1st carpometacarpal joint. Visualized soft tissues are within normal limits. IMPRESSION: No fracture or dislocation is seen. Electronically Signed   By: Julian Hy M.D.   On: 11/03/2015 22:28   Dg Hand Complete Right 11/03/2015   CLINICAL DATA:  Fall, hand pain EXAM: RIGHT HAND - COMPLETE 3+ VIEW COMPARISON:  None. FINDINGS: No fracture or dislocation is seen. Moderate to severe degenerative changes with central erosions at the 3rd DIP joint. Mild to moderate degenerative changes involving the 2nd and 5th DIP joints, the 2nd PIP joint, and the 1st CMC joint. Visualized soft tissues are within normal limits. IMPRESSION: No fracture or dislocation is seen. Mild to moderate degenerative changes. Electronically Signed   By: Julian Hy M.D.   On: 11/03/2015 22:32   Dg Hip Unilat With Pelvis 2-3 Views Right 11/03/2015  CLINICAL DATA:  Fall with right hip pain. EXAM: DG HIP (WITH OR WITHOUT PELVIS) 2-3V RIGHT COMPARISON:  None. FINDINGS: Right total hip arthroplasty, with no evidence of hardware fracture or loosening. No hip dislocation. Partially visualized left total hip arthroplasty. No osseous fracture or focal osseous lesion. No pelvic diastasis. Prominent degenerative changes in the lower lumbar spine. IMPRESSION: Bilateral total hip arthroplasty, with no evidence of hardware complication. No osseous fracture or hip dislocation. Electronically Signed   By: Ilona Sorrel M.D.   On: 11/03/2015 22:29    2245:  XR reassuring.  Splint wrist, f/u Ortho MD. Dx and testing d/w pt and family.  Questions answered.  Verb understanding, agreeable to d/c home with outpt f/u.   Francine Graven, DO 11/07/15 1410

## 2015-11-03 NOTE — ED Notes (Signed)
Patient was transported to vehicle via wheelchair, NAD, VSS.

## 2017-08-21 ENCOUNTER — Encounter (HOSPITAL_COMMUNITY): Payer: Self-pay

## 2017-08-21 ENCOUNTER — Emergency Department (HOSPITAL_COMMUNITY)
Admission: EM | Admit: 2017-08-21 | Discharge: 2017-08-22 | Disposition: A | Discharge status: Home / Self Care | Payer: Medicare Other | Attending: Emergency Medicine | Admitting: Emergency Medicine

## 2017-08-21 DIAGNOSIS — I10 Essential (primary) hypertension: Secondary | ICD-10-CM | POA: Diagnosis not present

## 2017-08-21 DIAGNOSIS — Y929 Unspecified place or not applicable: Secondary | ICD-10-CM | POA: Insufficient documentation

## 2017-08-21 DIAGNOSIS — Y939 Activity, unspecified: Secondary | ICD-10-CM | POA: Diagnosis not present

## 2017-08-21 DIAGNOSIS — Z7901 Long term (current) use of anticoagulants: Secondary | ICD-10-CM | POA: Diagnosis not present

## 2017-08-21 DIAGNOSIS — S61451A Open bite of right hand, initial encounter: Secondary | ICD-10-CM | POA: Insufficient documentation

## 2017-08-21 DIAGNOSIS — Z79899 Other long term (current) drug therapy: Secondary | ICD-10-CM | POA: Insufficient documentation

## 2017-08-21 DIAGNOSIS — W5501XA Bitten by cat, initial encounter: Secondary | ICD-10-CM | POA: Insufficient documentation

## 2017-08-21 DIAGNOSIS — Y999 Unspecified external cause status: Secondary | ICD-10-CM | POA: Insufficient documentation

## 2017-08-21 DIAGNOSIS — Z23 Encounter for immunization: Secondary | ICD-10-CM | POA: Insufficient documentation

## 2017-08-21 DIAGNOSIS — E039 Hypothyroidism, unspecified: Secondary | ICD-10-CM | POA: Diagnosis not present

## 2017-08-21 DIAGNOSIS — Z853 Personal history of malignant neoplasm of breast: Secondary | ICD-10-CM | POA: Diagnosis not present

## 2017-08-21 MED ORDER — HYDROGEN PEROXIDE 3 % EX SOLN
CUTANEOUS | Status: AC
  Administered 2017-08-21: 1
  Filled 2017-08-21: qty 473

## 2017-08-21 NOTE — ED Triage Notes (Signed)
Pt states she was bit on the right hand approx 8 pm tonight.  This cat belongs to the patient and states the cat is up to date on rabies.

## 2017-08-22 MED ORDER — AMOXICILLIN-POT CLAVULANATE 875-125 MG PO TABS
1.0000 | ORAL_TABLET | Freq: Once | ORAL | Status: AC
Start: 2017-08-22 — End: 2017-08-22
  Administered 2017-08-22: 1 via ORAL
  Filled 2017-08-22: qty 1

## 2017-08-22 MED ORDER — AMOXICILLIN-POT CLAVULANATE 875-125 MG PO TABS: 1.0000 | ORAL_TABLET | Freq: Two times a day (BID) | ORAL | 0 refills | Status: DC

## 2017-08-22 MED ORDER — TETANUS-DIPHTH-ACELL PERTUSSIS 5-2.5-18.5 LF-MCG/0.5 IM SUSP
0.5000 mL | Freq: Once | INTRAMUSCULAR | Status: AC
  Administered 2017-08-22: 0.5 mL via INTRAMUSCULAR
  Filled 2017-08-22: qty 0.5

## 2017-08-22 NOTE — ED Provider Notes (Signed)
[facility] EMERGENCY DEPARTMENT Provider Note   CSN: [id number] Arrival date & time: 08/21/17  2335     History   Chief Complaint Chief Complaint  Patient presents with  . Animal Bite    HPI Raven Moore is a 84 y.o. female.  The history is provided by the patient. No language interpreter was used.  Animal Bite  Contact animal:  Cat Location:  Hand Hand injury location:  R palm Time since incident:  3 hours Pain details:    Quality:  Aching   Severity:  Mild   Timing:  Constant   Progression:  Worsening Incident location:  Home Animal's rabies vaccination status:  Up to date Animal in possession: yes   Tetanus status:  Out of date Relieved by:  Nothing Worsened by:  Nothing Associated symptoms: no fever   Pt complains that her cat bite her right hand.  Cats shots are up to date.  Past Medical History:  Diagnosis Date  . Atrial fibrillation ([place])   . Breast cancer ([place]) 1982   On the right.  . DJD (degenerative joint disease)   . High cholesterol   . Hypertension   . Hypothyroidism 06/06/2012    Patient Active Problem List   Diagnosis Date Noted  . CAP (community acquired pneumonia) 06/06/2012  . Anemia 06/06/2012  . Dehydration 06/06/2012  . HTN (hypertension) 06/06/2012  . Hypothyroidism 06/06/2012    Past Surgical History:  Procedure Laterality Date  . ABDOMINAL HYSTERECTOMY     For fibroid tumors.  . Bilateral total hip replacements    . Right-sided mastectomy  1982    OB History    No data available       Home Medications    Prior to Admission medications   Medication Sig Start Date End Date Taking? Authorizing Provider  apixaban (ELIQUIS) 2.5 MG TABS tablet Take 2.5 mg by mouth 2 (two) times daily.    [provider]  calcium citrate (CALCITRATE - DOSED IN MG ELEMENTAL CALCIUM) 950 MG tablet Take 200 mg of elemental calcium by mouth daily.    [provider]  carboxymethylcellulose (REFRESH PLUS) 0.5 % SOLN Place 1  drop into both eyes 3 (three) times daily as needed.    [provider]  enalapril (VASOTEC) 10 MG tablet Take 10 mg by mouth daily.    [provider]  fish oil-omega-3 fatty acids 1000 MG capsule Take 1 g by mouth daily.    [provider]  furosemide (LASIX) 40 MG tablet Take 40 mg by mouth daily.    [provider]  levothyroxine (SYNTHROID, LEVOTHROID) 75 MCG tablet Take 75 mcg by mouth daily.    [provider]  lovastatin (MEVACOR) 20 MG tablet Take 20 mg by mouth at bedtime.    [provider]  Multiple Vitamin (MULTIVITAMIN WITH MINERALS) TABS Take 1 tablet by mouth daily.    [provider]  omeprazole (PRILOSEC) 20 MG capsule Take 20 mg by mouth daily.    [provider]  PARoxetine (PAXIL) 20 MG tablet Take 20 mg by mouth daily.    [provider]    Family History No family history on file.  Social History Social History   Tobacco Use  . Smoking status: Never Smoker  . Smokeless tobacco: Never Used  Substance Use Topics  . Alcohol use: No  . Drug use: No     Allergies   Novocain [procaine hcl]   Review of Systems Review of  Systems  Constitutional: Negative for fever.  All other systems reviewed and are negative.    Physical Exam Updated Vital Signs BP (!) 170/77   Pulse (!) 48   Temp 97.7 F (36.5 C) (Oral)   Resp 16   Ht 5\' 6"  (1.676 m)   Wt 59 kg (130 lb)   LMP 06/06/2012   SpO2 100%   BMI 20.98 kg/m   Physical Exam  Constitutional: She appears well-developed and well-nourished.  Musculoskeletal: She exhibits tenderness.  67mm puncture wound right hand at base of 5th finger.   Neurological: She is alert.  Psychiatric: She has a normal mood and affect.  Nursing note and vitals reviewed.    ED Treatments / Results  Labs (all labs ordered are listed, but only abnormal results are displayed) Labs Reviewed - No data to display  EKG  EKG Interpretation None        Radiology No results found.  Procedures Procedures (including critical care time)  Medications Ordered in ED Medications  hydrogen peroxide 3 % external solution (1 application  Given 07/20/98 2357)  Tdap (BOOSTRIX) injection 0.5 mL (0.5 mLs Intramuscular Given 08/22/17 0031)  amoxicillin-clavulanate (AUGMENTIN) 875-125 MG per tablet 1 tablet (1 tablet Oral Given 08/22/17 0029)     Initial Impression / Assessment and Plan / ED Course  I have reviewed the triage vital signs and the nursing notes.  Pertinent labs & imaging results that were available during my care of the patient were reviewed by me and considered in my medical decision making (see chart for details).     MDM  I discussed with pt and family risk of infection due to cat bite. Pt given Augmentin here, Rx for Augmentin.  Pt given a tetanus shot.   Final Clinical Impressions(s) / ED Diagnoses   Final diagnoses:  Cat bite, initial encounter   Meds ordered this encounter  Medications  . hydrogen peroxide 3 % external solution    Ruthy Dick   : cabinet override  . Tdap (BOOSTRIX) injection 0.5 mL  . amoxicillin-clavulanate (AUGMENTIN) 875-125 MG per tablet 1 tablet    ED Discharge Orders    None    An After Visit Summary was printed and given to the patient.    Fransico Meadow, [place] 08/22/17 1749    Ezequiel Essex, MD 08/22/17 [phone]

## 2017-08-22 NOTE — Discharge Instructions (Signed)
Return for recheck Sunday morning.  Soak area 20 minutes 4 times a day

## 2017-09-21 ENCOUNTER — Emergency Department (HOSPITAL_COMMUNITY): Payer: Medicare Other

## 2017-09-21 ENCOUNTER — Other Ambulatory Visit: Payer: Self-pay

## 2017-09-21 ENCOUNTER — Encounter (HOSPITAL_COMMUNITY): Payer: Self-pay | Admitting: Emergency Medicine

## 2017-09-21 ENCOUNTER — Emergency Department (HOSPITAL_COMMUNITY)
Admission: EM | Admit: 2017-09-21 | Discharge: 2017-09-21 | Disposition: A | Discharge status: Home / Self Care | Payer: Medicare Other | Attending: Emergency Medicine | Admitting: Emergency Medicine

## 2017-09-21 DIAGNOSIS — I10 Essential (primary) hypertension: Secondary | ICD-10-CM | POA: Diagnosis not present

## 2017-09-21 DIAGNOSIS — Y998 Other external cause status: Secondary | ICD-10-CM | POA: Insufficient documentation

## 2017-09-21 DIAGNOSIS — Z79899 Other long term (current) drug therapy: Secondary | ICD-10-CM | POA: Diagnosis not present

## 2017-09-21 DIAGNOSIS — Z96643 Presence of artificial hip joint, bilateral: Secondary | ICD-10-CM | POA: Diagnosis not present

## 2017-09-21 DIAGNOSIS — E039 Hypothyroidism, unspecified: Secondary | ICD-10-CM | POA: Insufficient documentation

## 2017-09-21 DIAGNOSIS — Z7901 Long term (current) use of anticoagulants: Secondary | ICD-10-CM | POA: Insufficient documentation

## 2017-09-21 DIAGNOSIS — S52501A Unspecified fracture of the lower end of right radius, initial encounter for closed fracture: Secondary | ICD-10-CM | POA: Insufficient documentation

## 2017-09-21 DIAGNOSIS — Y9389 Activity, other specified: Secondary | ICD-10-CM | POA: Diagnosis not present

## 2017-09-21 DIAGNOSIS — S62101A Fracture of unspecified carpal bone, right wrist, initial encounter for closed fracture: Secondary | ICD-10-CM

## 2017-09-21 DIAGNOSIS — W010XXA Fall on same level from slipping, tripping and stumbling without subsequent striking against object, initial encounter: Secondary | ICD-10-CM | POA: Diagnosis not present

## 2017-09-21 DIAGNOSIS — Z853 Personal history of malignant neoplasm of breast: Secondary | ICD-10-CM | POA: Diagnosis not present

## 2017-09-21 DIAGNOSIS — Y92013 Bedroom of single-family (private) house as the place of occurrence of the external cause: Secondary | ICD-10-CM | POA: Insufficient documentation

## 2017-09-21 DIAGNOSIS — S6991XA Unspecified injury of right wrist, hand and finger(s), initial encounter: Secondary | ICD-10-CM | POA: Diagnosis present

## 2017-09-21 DIAGNOSIS — W19XXXA Unspecified fall, initial encounter: Secondary | ICD-10-CM

## 2017-09-21 LAB — COMPREHENSIVE METABOLIC PANEL
ALT: 13 U/L — AB (ref 14–54)
ANION GAP: 10 (ref 5–15)
AST: 23 U/L (ref 15–41)
Albumin: 3.5 g/dL (ref 3.5–5.0)
Alkaline Phosphatase: 46 U/L (ref 38–126)
BUN: 26 mg/dL — ABNORMAL HIGH (ref 6–20)
CALCIUM: 10.3 mg/dL (ref 8.9–10.3)
CHLORIDE: 100 mmol/L — AB (ref 101–111)
CO2: 29 mmol/L (ref 22–32)
CREATININE: 0.9 mg/dL (ref 0.44–1.00)
GFR calc Af Amer: >60 mL/min (ref >60)
GFR, EST NON AFRICAN AMERICAN: 54 mL/min — AB (ref >60)
Glucose, Bld: 102 mg/dL — ABNORMAL HIGH (ref 65–99)
Potassium: 3.2 mmol/L — ABNORMAL LOW (ref 3.5–5.1)
Sodium: 139 mmol/L (ref 135–145)
Total Bilirubin: 0.9 mg/dL (ref 0.3–1.2)
Total Protein: 6.8 g/dL (ref 6.5–8.1)

## 2017-09-21 LAB — CBC WITH DIFFERENTIAL/PLATELET
Basophils Absolute: 0 10*3/uL (ref 0.0–0.1)
Basophils Relative: 0 %
EOS ABS: 0 10*3/uL (ref 0.0–0.7)
EOS PCT: 1 %
HCT: 34.9 % — ABNORMAL LOW (ref 36.0–46.0)
Hemoglobin: 10.7 g/dL — ABNORMAL LOW (ref 12.0–15.0)
LYMPHS ABS: 0.7 10*3/uL (ref 0.7–4.0)
LYMPHS PCT: 14 %
MCH: 28.2 pg (ref 26.0–34.0)
MCHC: 30.7 g/dL (ref 30.0–36.0)
MCV: 92.1 fL (ref 78.0–100.0)
MONO ABS: 0.6 10*3/uL (ref 0.1–1.0)
Monocytes Relative: 12 %
Neutro Abs: 3.8 10*3/uL (ref 1.7–7.7)
Neutrophils Relative %: 73 %
PLATELETS: 218 10*3/uL (ref 150–400)
RBC: 3.79 MIL/uL — AB (ref 3.87–5.11)
RDW: 14.5 % (ref 11.5–15.5)
WBC: 5.2 10*3/uL (ref 4.0–10.5)

## 2017-09-21 NOTE — ED Triage Notes (Signed)
PT's daughter reports multiple falls over the past few weeks but last night fell and landed upon her right wrist. Swelling and pain to right wrist noted.

## 2017-09-21 NOTE — ED Provider Notes (Signed)
[facility] EMERGENCY DEPARTMENT Provider Note   CSN: [id number] Arrival date & time: 09/21/17  1104     History   Chief Complaint Chief Complaint  Patient presents with  . Fall    HPI Raven Moore is a 84 y.o. female.  Level 5 caveat secondary to dementia.  Pleasant 84 year old with dementia lives at home with family.  There is been frequent falls as the patient does not remember to use her cane.  There was a fall again last night around midnight that was not witnessed but her daughter heard her fall and went to get her.  At that point she noticed that the right wrist was swollen and she iced it.  The patient was able to get up and get back in bed.  Today it was noticed that the right wrist was swollen and had less range of motion.  So she brought her up to the emergency department.  The patient lives at home with family and also has intermittent health aides.  There is been no recent illness per the daughter.  The history is provided by the patient and a relative.  Fall  This is a new problem. The current episode started 12 to 24 hours ago. The problem has been resolved. Pertinent negatives include no chest pain, no abdominal pain, no headaches and no shortness of breath. Associated symptoms comments: Right wrist and shoulder pain. The symptoms are aggravated by bending. The symptoms are relieved by ice and rest. She has tried nothing for the symptoms. The treatment provided mild relief.    Past Medical History:  Diagnosis Date  . Atrial fibrillation ([place])   . Breast cancer ([place]) 1982   On the right.  . DJD (degenerative joint disease)   . High cholesterol   . Hypertension   . Hypothyroidism 06/06/2012    Patient Active Problem List   Diagnosis Date Noted  . CAP (community acquired pneumonia) 06/06/2012  . Anemia 06/06/2012  . Dehydration 06/06/2012  . HTN (hypertension) 06/06/2012  . Hypothyroidism 06/06/2012    Past Surgical History:  Procedure Laterality Date  .  ABDOMINAL HYSTERECTOMY     For fibroid tumors.  . Bilateral total hip replacements    . Right-sided mastectomy  1982    OB History    Gravida Para Term Preterm AB Living             1   SAB TAB Ectopic Multiple Live Births                   Home Medications    Prior to Admission medications   Medication Sig Start Date End Date Taking? Authorizing Provider  amoxicillin-clavulanate (AUGMENTIN) 875-125 MG tablet Take 1 tablet by mouth every 12 (twelve) hours. 08/22/17   Fransico Meadow, PA-C  apixaban (ELIQUIS) 2.5 MG TABS tablet Take 2.5 mg by mouth 2 (two) times daily.    [provider]  calcium citrate (CALCITRATE - DOSED IN MG ELEMENTAL CALCIUM) 950 MG tablet Take 200 mg of elemental calcium by mouth daily.    [provider]  carboxymethylcellulose (REFRESH PLUS) 0.5 % SOLN Place 1 drop into both eyes 3 (three) times daily as needed.    [provider]  enalapril (VASOTEC) 10 MG tablet Take 10 mg by mouth daily.    [provider]  fish oil-omega-3 fatty acids 1000 MG capsule Take 1 g by mouth daily.    [provider]  furosemide (LASIX) 40 MG tablet  Take 40 mg by mouth daily.    [provider]  levothyroxine (SYNTHROID, LEVOTHROID) 75 MCG tablet Take 75 mcg by mouth daily.    [provider]  lovastatin (MEVACOR) 20 MG tablet Take 20 mg by mouth at bedtime.    [provider]  Multiple Vitamin (MULTIVITAMIN WITH MINERALS) TABS Take 1 tablet by mouth daily.    [provider]  omeprazole (PRILOSEC) 20 MG capsule Take 20 mg by mouth daily.    [provider]  PARoxetine (PAXIL) 20 MG tablet Take 20 mg by mouth daily.    [provider]    Family History History reviewed. No pertinent family history.  Social History Social History   Tobacco Use  . Smoking status: Never Smoker  . Smokeless tobacco: Never Used  Substance Use Topics  . Alcohol use: No  . Drug use: No      Allergies   Novocain [procaine hcl]   Review of Systems Review of Systems  Constitutional: Negative for chills and fever.  HENT: Negative for ear pain and sore throat.   Eyes: Negative for pain and visual disturbance.  Respiratory: Negative for cough and shortness of breath.   Cardiovascular: Negative for chest pain and palpitations.  Gastrointestinal: Negative for abdominal pain and vomiting.  Genitourinary: Negative for dysuria and hematuria.  Musculoskeletal: Positive for arthralgias (Right wrist and shoulder). Negative for back pain.  Skin: Negative for color change and rash.  Neurological: Negative for seizures, syncope and headaches.  All other systems reviewed and are negative.    Physical Exam Updated Vital Signs BP 126/72   Pulse 74   Resp 16   Ht 5\' 6"  (1.676 m)   Wt 59 kg (130 lb)   LMP 06/06/2012   SpO2 95%   BMI 20.98 kg/m   Physical Exam  Constitutional: She appears well-developed and well-nourished. No distress.  HENT:  Head: Normocephalic and atraumatic.  Eyes: Conjunctivae are normal.  Neck: Neck supple.  Cardiovascular: Regular rhythm. Bradycardia present.  No murmur heard. Pulmonary/Chest: Effort normal and breath sounds normal. No respiratory distress.  Abdominal: Soft. There is no tenderness.  Musculoskeletal: She exhibits no edema.       Right shoulder: She exhibits decreased range of motion, tenderness and pain. She exhibits no deformity and normal pulse.       Left shoulder: Normal.       Right elbow: She exhibits laceration (Healing 3 cm old laceration from over a week ago.  No surrounding erythema or discharge.). She exhibits normal range of motion and no swelling. No tenderness found.       Left elbow: Normal.       Right wrist: She exhibits decreased range of motion, tenderness, bony tenderness, swelling and deformity. She exhibits no laceration.       Left wrist: Normal.       Right hip: Normal.       Left hip: Normal.        Right knee: Normal.       Left knee: Normal.       Right ankle: Normal.       Left ankle: Normal.       Right hand: Normal.  Neurological: She is alert.  Skin: Skin is warm and dry.  Psychiatric: She has a normal mood and affect.  Nursing note and vitals reviewed.    ED Treatments / Results  Labs (all labs ordered are listed, but only abnormal results are displayed) Labs Reviewed  CBC WITH DIFFERENTIAL/PLATELET - Abnormal; Notable for the following components:      Result Value   RBC 3.79 (*)    Hemoglobin 10.7 (*)    HCT 34.9 (*)    All other components within normal limits  COMPREHENSIVE METABOLIC PANEL - Abnormal; Notable for the following components:   Potassium 3.2 (*)    Chloride 100 (*)    Glucose, Bld 102 (*)    BUN 26 (*)    ALT 13 (*)    GFR calc non Af Amer 54 (*)    All other components within normal limits    EKG  EKG Interpretation None       Radiology Dg Wrist Complete Right  Result Date: 09/21/2017 CLINICAL DATA:  Right wrist pain after fall last night. EXAM: RIGHT WRIST - COMPLETE 3+ VIEW COMPARISON:  Radiograph of November 03, 2015. FINDINGS: Mildly displaced fracture is seen involving the distal right radius. Narrowing and sclerosis of the first carpometacarpal joint is noted. Mildly displaced ulnar styloid fracture is noted. IMPRESSION: Mildly displaced distal right radial fracture. Mildly displaced ulnar styloid fracture. Osteoarthritis of the first carpometacarpal joint. Electronically Signed   By: Marijo Conception, M.D.   On: 09/21/2017 12:31    Procedures Procedures (including critical care time)  Medications Ordered in ED Medications - No data to display   Initial Impression / Assessment and Plan / ED Course  I have reviewed the triage vital signs and the nursing notes.  Pertinent labs & imaging results that were available during my care of the patient were reviewed by me and considered in my medical decision making (see chart for  details).  Clinical Course as of Sep 23 2126  Mon Sep 21, 2017  1732 Patient had a right distal radius and ulna fracture that was impacted.  She was splinted by the nurses and has normal CSM is distal.  I have given them the number for the hand doctor to follow-up with.  The daughter asked after I inquired about how they were doing at home if she would be a candidate for some services.  I contacted case management and they said to put in an order in epic and they will contact the family tomorrow.  [MB]    Clinical Course User Index [MB] Hayden Rasmussen, MD     Final Clinical Impressions(s) / ED Diagnoses   Final diagnoses:  Closed fracture of right wrist, initial encounter  Fall, initial encounter    ED Discharge Orders    None       Hayden Rasmussen, MD 09/22/17 2128

## 2017-09-21 NOTE — ED Notes (Signed)
EDP at bedside updating patient and family. 

## 2017-09-21 NOTE — Discharge Instructions (Signed)
You were seen in the emergency department for a fall in which she broke her right wrist.  Splint was applied and you will need to follow-up with hand surgery Dr. Caralyn Guile. Elevate, ice and Tylenol for pain.  If any worsening symptoms please return to the emergency department.

## 2017-09-21 NOTE — ED Notes (Signed)
Pt transported to xray 

## 2017-09-21 NOTE — ED Notes (Signed)
Pt returned from xray

## 2018-01-21 ENCOUNTER — Other Ambulatory Visit: Payer: Self-pay

## 2018-01-21 ENCOUNTER — Emergency Department (HOSPITAL_COMMUNITY)
Admission: EM | Admit: 2018-01-21 | Discharge: 2018-01-21 | Disposition: A | Discharge status: Home / Self Care | Payer: Medicare Other | Attending: Emergency Medicine | Admitting: Emergency Medicine

## 2018-01-21 ENCOUNTER — Emergency Department (HOSPITAL_COMMUNITY): Payer: Medicare Other

## 2018-01-21 ENCOUNTER — Encounter (HOSPITAL_COMMUNITY): Payer: Self-pay | Admitting: *Deleted

## 2018-01-21 DIAGNOSIS — Z79899 Other long term (current) drug therapy: Secondary | ICD-10-CM | POA: Insufficient documentation

## 2018-01-21 DIAGNOSIS — R0600 Dyspnea, unspecified: Secondary | ICD-10-CM

## 2018-01-21 DIAGNOSIS — R0602 Shortness of breath: Secondary | ICD-10-CM | POA: Insufficient documentation

## 2018-01-21 DIAGNOSIS — Z7901 Long term (current) use of anticoagulants: Secondary | ICD-10-CM | POA: Diagnosis not present

## 2018-01-21 DIAGNOSIS — F0390 Unspecified dementia without behavioral disturbance: Secondary | ICD-10-CM | POA: Insufficient documentation

## 2018-01-21 DIAGNOSIS — J810 Acute pulmonary edema: Secondary | ICD-10-CM | POA: Insufficient documentation

## 2018-01-21 DIAGNOSIS — E039 Hypothyroidism, unspecified: Secondary | ICD-10-CM | POA: Diagnosis not present

## 2018-01-21 DIAGNOSIS — I10 Essential (primary) hypertension: Secondary | ICD-10-CM | POA: Diagnosis not present

## 2018-01-21 DIAGNOSIS — Z853 Personal history of malignant neoplasm of breast: Secondary | ICD-10-CM | POA: Insufficient documentation

## 2018-01-21 DIAGNOSIS — R531 Weakness: Secondary | ICD-10-CM | POA: Diagnosis present

## 2018-01-21 LAB — CBC
HCT: 31.2 % — ABNORMAL LOW (ref 36.0–46.0)
Hemoglobin: 9.4 g/dL — ABNORMAL LOW (ref 12.0–15.0)
MCH: 25 pg — ABNORMAL LOW (ref 26.0–34.0)
MCHC: 30.1 g/dL (ref 30.0–36.0)
MCV: 83 fL (ref 78.0–100.0)
Platelets: 316 10*3/uL (ref 150–400)
RBC: 3.76 MIL/uL — ABNORMAL LOW (ref 3.87–5.11)
RDW: 16.3 % — ABNORMAL HIGH (ref 11.5–15.5)
WBC: 4.8 10*3/uL (ref 4.0–10.5)

## 2018-01-21 LAB — URINALYSIS, ROUTINE W REFLEX MICROSCOPIC
Bilirubin Urine: NEGATIVE
Glucose, UA: NEGATIVE mg/dL
Hgb urine dipstick: NEGATIVE
Ketones, ur: NEGATIVE mg/dL
Leukocytes, UA: NEGATIVE
Nitrite: NEGATIVE
Protein, ur: NEGATIVE mg/dL
Specific Gravity, Urine: 1.009 (ref 1.005–1.030)
pH: 7 (ref 5.0–8.0)

## 2018-01-21 LAB — BASIC METABOLIC PANEL
Anion gap: 7 (ref 5–15)
BUN: 23 mg/dL (ref 8–23)
CO2: 32 mmol/L (ref 22–32)
Calcium: 10.2 mg/dL (ref 8.9–10.3)
Chloride: 103 mmol/L (ref 98–111)
Creatinine, Ser: 1.08 mg/dL — ABNORMAL HIGH (ref 0.44–1.00)
GFR calc Af Amer: 50 mL/min — ABNORMAL LOW (ref >60)
GFR calc non Af Amer: 43 mL/min — ABNORMAL LOW (ref >60)
Glucose, Bld: 98 mg/dL (ref 70–99)
Potassium: 3.9 mmol/L (ref 3.5–5.1)
Sodium: 142 mmol/L (ref 135–145)

## 2018-01-21 LAB — TROPONIN I: Troponin I: 0.03 ng/mL (ref <0.03)

## 2018-01-21 LAB — BRAIN NATRIURETIC PEPTIDE: B Natriuretic Peptide: 333 pg/mL — ABNORMAL HIGH (ref 0.0–100.0)

## 2018-01-21 MED ORDER — FUROSEMIDE 40 MG PO TABS
20.0000 mg | ORAL_TABLET | Freq: Once | ORAL | Status: AC
  Administered 2018-01-21: 20 mg via ORAL
  Filled 2018-01-21: qty 1

## 2018-01-21 MED ORDER — FUROSEMIDE 20 MG PO TABS: 40.0000 mg | ORAL_TABLET | Freq: Two times a day (BID) | ORAL | 0 refills | Status: DC

## 2018-01-21 MED ORDER — POTASSIUM CHLORIDE ER 20 MEQ PO TBCR: 20.0000 meq | EXTENDED_RELEASE_TABLET | Freq: Every day | ORAL | 0 refills | Status: DC

## 2018-01-21 MED ORDER — FUROSEMIDE 40 MG PO TABS: 40.0000 mg | ORAL_TABLET | Freq: Two times a day (BID) | ORAL | 0 refills | Status: DC

## 2018-01-21 MED ORDER — IOPAMIDOL (ISOVUE-370) INJECTION 76%
80.0000 mL | Freq: Once | INTRAVENOUS | Status: AC | PRN
  Administered 2018-01-21: 80 mL via INTRAVENOUS

## 2018-01-21 MED ORDER — POTASSIUM CHLORIDE CRYS ER 20 MEQ PO TBCR
20.0000 meq | EXTENDED_RELEASE_TABLET | Freq: Once | ORAL | Status: AC
  Administered 2018-01-21: 20 meq via ORAL
  Filled 2018-01-21: qty 1

## 2018-01-21 NOTE — ED Notes (Signed)
Pt returned from xray

## 2018-01-21 NOTE — ED Notes (Signed)
Patient transported to X-ray 

## 2018-01-21 NOTE — ED Notes (Signed)
Family at bedside. 

## 2018-01-21 NOTE — Discharge Instructions (Addendum)
You have a mild amount of fluid around your lungs. This is likely what is making you short of breath. You do not have a blood clot. I want you to increase your diuretic (lasix) for the next week to help with this. I want you to take 40 mg twice per day for 7 days and then resume previous dosing of once daily. You are also being prescribed potassium to supplement what will be lost in your urine.

## 2018-01-21 NOTE — ED Provider Notes (Signed)
[facility] Provider Note   CSN: [id number] Arrival date & time: 01/21/18  1040     History   Chief Complaint Chief Complaint  Patient presents with  . Weakness    HPI Raven Moore is a 84 y.o. female.  HPI   84 year old female presenting with her daughter for evaluation of decreased energy levels and shortness of breath.  Patient has a past history of dementia and is not provide much useful history.  Her daughter reports that approximately 5 days to about a week ago she noticed that patient seem more tired.  Normally she would stand at the same to pressure hair her teeth in the last several days has been asking for a stool.  She has seemed to be short of breath with ambulating around the home.  She has also been complaining of some pain substernally with deep breaths.  She was seen in her PCPs office yesterday for the same complaints.  They called her today and suggested she come to the emergency room because they saw fluid on her CXR.   Past Medical History:  Diagnosis Date  . Atrial fibrillation ([place])   . Breast cancer ([place]) 1982   On the right.  . DJD (degenerative joint disease)   . High cholesterol   . Hypertension   . Hypothyroidism 06/06/2012    Patient Active Problem List   Diagnosis Date Noted  . CAP (community acquired pneumonia) 06/06/2012  . Anemia 06/06/2012  . Dehydration 06/06/2012  . HTN (hypertension) 06/06/2012  . Hypothyroidism 06/06/2012    Past Surgical History:  Procedure Laterality Date  . ABDOMINAL HYSTERECTOMY     For fibroid tumors.  . Bilateral total hip replacements    . Right-sided mastectomy  1982     OB History    Gravida      Para      Term      Preterm      AB      Living  1     SAB      TAB      Ectopic      Multiple      Live Births               Home Medications    Prior to Admission medications   Medication Sig Start Date End Date Taking? Authorizing Provider  apixaban  (ELIQUIS) 2.5 MG TABS tablet Take 2.5 mg by mouth 2 (two) times daily.   Yes [provider]  calcium citrate (CALCITRATE - DOSED IN MG ELEMENTAL CALCIUM) 950 MG tablet Take 200 mg of elemental calcium by mouth daily.   Yes [provider]  carboxymethylcellulose (REFRESH PLUS) 0.5 % SOLN Place 1 drop into both eyes 3 (three) times daily as needed.   Yes [provider]  fish oil-omega-3 fatty acids 1000 MG capsule Take 1 g by mouth daily.   Yes [provider]  furosemide (LASIX) 40 MG tablet Take 40 mg by mouth daily.   Yes [provider]  levothyroxine (SYNTHROID, LEVOTHROID) 75 MCG tablet Take 75 mcg by mouth daily.   Yes [provider]  lovastatin (MEVACOR) 20 MG tablet Take 20 mg by mouth at bedtime.   Yes [provider]  omeprazole (PRILOSEC) 20 MG capsule Take 20 mg by mouth daily.   Yes [provider]  PARoxetine (PAXIL) 20 MG tablet Take 30 mg by mouth daily.    Yes [provider]  amoxicillin-clavulanate (AUGMENTIN)  875-125 MG tablet Take 1 tablet by mouth every 12 (twelve) hours. Patient not taking: Reported on 01/21/2018 08/22/17   Fransico Meadow, PA-C    Family History No family history on file.  Social History Social History   Tobacco Use  . Smoking status: Never Smoker  . Smokeless tobacco: Never Used  Substance Use Topics  . Alcohol use: No  . Drug use: No     Allergies   Novocain [procaine hcl]   Review of Systems Review of Systems  Level 5 caveat because of dementia.  Physical Exam Updated Vital Signs BP (!) 149/79   Pulse 71   Temp 98.1 F (36.7 C) (Oral)   Resp (!) 21   Ht 5\' 6"  (1.676 m)   Wt 59 kg (130 lb)   LMP 06/06/2012   SpO2 91%   BMI 20.98 kg/m   Physical Exam  Constitutional: She appears well-developed and well-nourished. No distress.  HENT:  Head: Normocephalic and atraumatic.  Eyes: Conjunctivae are normal. Right eye exhibits no discharge. Left eye  exhibits no discharge.  Neck: Neck supple.  Cardiovascular: Normal rate, regular rhythm and normal heart sounds. Exam reveals no gallop and no friction rub.  No murmur heard. Pulmonary/Chest: Effort normal and breath sounds normal. No respiratory distress.  Abdominal: Soft. She exhibits no distension. There is no tenderness.  Musculoskeletal: She exhibits no edema or tenderness.  Lower extremities symmetric as compared to each other. No calf tenderness. Negative Homan's. No palpable cords.   Neurological: She is alert.  Skin: Skin is warm and dry.  Psychiatric: She has a normal mood and affect. Her behavior is normal. Thought content normal.  Nursing note and vitals reviewed.    ED Treatments / Results  Labs (all labs ordered are listed, but only abnormal results are displayed) Labs Reviewed  BASIC METABOLIC PANEL - Abnormal; Notable for the following components:      Result Value   Creatinine, Ser 1.08 (*)    GFR calc non Af Amer 43 (*)    GFR calc Af Amer 50 (*)    All other components within normal limits  CBC - Abnormal; Notable for the following components:   RBC 3.76 (*)    Hemoglobin 9.4 (*)    HCT 31.2 (*)    MCH 25.0 (*)    RDW 16.3 (*)    All other components within normal limits  BRAIN NATRIURETIC PEPTIDE - Abnormal; Notable for the following components:   B Natriuretic Peptide 333.0 (*)    All other components within normal limits  TROPONIN I - Abnormal; Notable for the following components:   Troponin I 0.03 (*)    All other components within normal limits  URINALYSIS, ROUTINE W REFLEX MICROSCOPIC - Abnormal; Notable for the following components:   Color, Urine STRAW (*)    All other components within normal limits    EKG None  Radiology Dg Chest 2 View  Result Date: 01/21/2018 CLINICAL DATA:  Five day history of generalized weakness, shortness of breath with exertion and LEFT-sided chest pain underlying the LEFT breast. Personal history of RIGHT breast  cancer with mastectomy in 1982. EXAM: CHEST - 2 VIEW COMPARISON:  06/06/2012. FINDINGS: AP ERECT and LATERAL images were obtained. Heart mildly enlarged for technique and degree of inspiration. Thoracic aorta atherosclerotic. Moderately large hiatal hernia. Suboptimal inspiration which accounts for atelectasis in the RIGHT LOWER LOBE. Lungs otherwise clear. Mild pulmonary venous hypertension without evidence of overt edema. No visible pleural effusions. Degenerative  changes involving the thoracic spine with compression fractures of what I believe are T9, T11 and T12, unchanged from the prior examination. IMPRESSION: 1. Suboptimal inspiration which accounts for atelectasis in the RIGHT LOWER LOBE. No acute cardiopulmonary disease otherwise. 2. Mild cardiomegaly. Mild pulmonary venous hypertension without evidence of overt edema. 3. Moderately large hiatal hernia. 4.  Aortic Atherosclerosis (ICD10-170.0) Electronically Signed   By: Evangeline Dakin M.D.   On: 01/21/2018 14:23    Procedures Procedures (including critical care time)  Medications Ordered in ED Medications  iopamidol (ISOVUE-370) 76 % injection 80 mL (80 mLs Intravenous Contrast Given 01/21/18 1537)     Initial Impression / Assessment and Plan / ED Course  I have reviewed the triage vital signs and the nursing notes.  Pertinent labs & imaging results that were available during my care of the patient were reviewed by me and considered in my medical decision making (see chart for details).    84 year old female with increased weakness.  She has seemed short of breath at times per family.  Chest x-ray was some mild edema.  She looks comfortable at rest.  We will increase her Lasix for a few days.  Return precautions discussed.  Outpatient follow-up otherwise.   Final Clinical Impressions(s) / ED Diagnoses   Final diagnoses:  Dyspnea, unspecified type  Acute pulmonary edema [facility])    ED Discharge Orders    None       Virgel Manifold, MD 01/24/18 2258

## 2018-01-21 NOTE — ED Triage Notes (Addendum)
Pt's family reports CXR was done yesterday and showed that she had fluid on her lungs and was told to come to the ED. Family reports pt has been lethargic x 5 days which sent her to her PCP yesterday. Pt reports pain underneath left breast with breathing. Denies SOB.

## 2018-01-21 NOTE — ED Notes (Signed)
CRITICAL VALUE ALERT  Critical Value:  Troponin 0.03  Date & Time Notied:  01/21/2018, 1436  Provider Notified: Dr. Wilson Singer  Orders Received/Actions taken: see chart

## 2018-06-18 ENCOUNTER — Encounter (HOSPITAL_COMMUNITY): Payer: Self-pay

## 2018-06-18 ENCOUNTER — Other Ambulatory Visit: Payer: Self-pay

## 2018-06-18 ENCOUNTER — Observation Stay (HOSPITAL_COMMUNITY)
Admission: EM | Admit: 2018-06-18 | Discharge: 2018-06-20 | Disposition: A | Discharge status: Home / Self Care | Payer: Medicare Other | Attending: Internal Medicine | Admitting: Internal Medicine

## 2018-06-18 ENCOUNTER — Emergency Department (HOSPITAL_COMMUNITY): Payer: Medicare Other

## 2018-06-18 DIAGNOSIS — D649 Anemia, unspecified: Secondary | ICD-10-CM | POA: Diagnosis not present

## 2018-06-18 DIAGNOSIS — R109 Unspecified abdominal pain: Secondary | ICD-10-CM | POA: Insufficient documentation

## 2018-06-18 DIAGNOSIS — Z79899 Other long term (current) drug therapy: Secondary | ICD-10-CM | POA: Diagnosis not present

## 2018-06-18 DIAGNOSIS — E7800 Pure hypercholesterolemia, unspecified: Secondary | ICD-10-CM | POA: Diagnosis not present

## 2018-06-18 DIAGNOSIS — Z853 Personal history of malignant neoplasm of breast: Secondary | ICD-10-CM | POA: Insufficient documentation

## 2018-06-18 DIAGNOSIS — I482 Chronic atrial fibrillation, unspecified: Secondary | ICD-10-CM | POA: Diagnosis present

## 2018-06-18 DIAGNOSIS — K319 Disease of stomach and duodenum, unspecified: Secondary | ICD-10-CM | POA: Insufficient documentation

## 2018-06-18 DIAGNOSIS — E039 Hypothyroidism, unspecified: Secondary | ICD-10-CM | POA: Diagnosis not present

## 2018-06-18 DIAGNOSIS — Z7901 Long term (current) use of anticoagulants: Secondary | ICD-10-CM | POA: Insufficient documentation

## 2018-06-18 DIAGNOSIS — I10 Essential (primary) hypertension: Secondary | ICD-10-CM | POA: Diagnosis not present

## 2018-06-18 DIAGNOSIS — K449 Diaphragmatic hernia without obstruction or gangrene: Secondary | ICD-10-CM | POA: Insufficient documentation

## 2018-06-18 DIAGNOSIS — K633 Ulcer of intestine: Secondary | ICD-10-CM | POA: Insufficient documentation

## 2018-06-18 DIAGNOSIS — Z79890 Hormone replacement therapy (postmenopausal): Secondary | ICD-10-CM | POA: Insufficient documentation

## 2018-06-18 DIAGNOSIS — R195 Other fecal abnormalities: Secondary | ICD-10-CM | POA: Diagnosis present

## 2018-06-18 DIAGNOSIS — K639 Disease of intestine, unspecified: Secondary | ICD-10-CM | POA: Insufficient documentation

## 2018-06-18 DIAGNOSIS — Z96643 Presence of artificial hip joint, bilateral: Secondary | ICD-10-CM | POA: Diagnosis not present

## 2018-06-18 DIAGNOSIS — K2950 Unspecified chronic gastritis without bleeding: Secondary | ICD-10-CM | POA: Diagnosis not present

## 2018-06-18 DIAGNOSIS — D509 Iron deficiency anemia, unspecified: Principal | ICD-10-CM | POA: Insufficient documentation

## 2018-06-18 LAB — COMPREHENSIVE METABOLIC PANEL
ALK PHOS: 52 U/L (ref 38–126)
ALT: 13 U/L (ref 0–44)
AST: 27 U/L (ref 15–41)
Albumin: 3.4 g/dL — ABNORMAL LOW (ref 3.5–5.0)
Anion gap: 9 (ref 5–15)
BUN: 36 mg/dL — ABNORMAL HIGH (ref 8–23)
CALCIUM: 10.1 mg/dL (ref 8.9–10.3)
CO2: 24 mmol/L (ref 22–32)
CREATININE: 1.13 mg/dL — AB (ref 0.44–1.00)
Chloride: 102 mmol/L (ref 98–111)
GFR calc non Af Amer: 42 mL/min — ABNORMAL LOW (ref >60)
GFR, EST AFRICAN AMERICAN: 49 mL/min — AB (ref >60)
Glucose, Bld: 142 mg/dL — ABNORMAL HIGH (ref 70–99)
Potassium: 4.7 mmol/L (ref 3.5–5.1)
SODIUM: 135 mmol/L (ref 135–145)
Total Bilirubin: 0.3 mg/dL (ref 0.3–1.2)
Total Protein: 6.8 g/dL (ref 6.5–8.1)

## 2018-06-18 LAB — I-STAT TROPONIN, ED: Troponin i, poc: 0.02 ng/mL (ref 0.00–0.08)

## 2018-06-18 LAB — CBC WITH DIFFERENTIAL/PLATELET
Abs Immature Granulocytes: 0.07 10*3/uL (ref 0.00–0.07)
Basophils Absolute: 0 10*3/uL (ref 0.0–0.1)
Basophils Relative: 0 %
EOS PCT: 0 %
Eosinophils Absolute: 0 10*3/uL (ref 0.0–0.5)
HEMATOCRIT: 20.8 % — AB (ref 36.0–46.0)
HEMOGLOBIN: 5.7 g/dL — AB (ref 12.0–15.0)
Immature Granulocytes: 1 %
LYMPHS PCT: 4 %
Lymphs Abs: 0.4 10*3/uL — ABNORMAL LOW (ref 0.7–4.0)
MCH: 22.8 pg — AB (ref 26.0–34.0)
MCHC: 27.4 g/dL — AB (ref 30.0–36.0)
MCV: 83.2 fL (ref 80.0–100.0)
MONO ABS: 0.9 10*3/uL (ref 0.1–1.0)
Monocytes Relative: 8 %
Neutro Abs: 10.4 10*3/uL — ABNORMAL HIGH (ref 1.7–7.7)
Neutrophils Relative %: 87 %
Platelets: 401 10*3/uL — ABNORMAL HIGH (ref 150–400)
RBC: 2.5 MIL/uL — ABNORMAL LOW (ref 3.87–5.11)
RDW: 20.1 % — ABNORMAL HIGH (ref 11.5–15.5)
WBC: 11.8 10*3/uL — AB (ref 4.0–10.5)
nRBC: 0.4 % — ABNORMAL HIGH (ref 0.0–0.2)

## 2018-06-18 LAB — FERRITIN: Ferritin: 8 ng/mL — ABNORMAL LOW (ref 11–307)

## 2018-06-18 LAB — LIPASE, BLOOD: Lipase: 34 U/L (ref 11–51)

## 2018-06-18 LAB — IRON AND TIBC
Iron: 8 ug/dL — ABNORMAL LOW (ref 28–170)
SATURATION RATIOS: 2 % — AB (ref 10.4–31.8)
TIBC: 433 ug/dL (ref 250–450)
UIBC: 425 ug/dL

## 2018-06-18 LAB — RETICULOCYTES
IMMATURE RETIC FRACT: 7 % (ref 2.3–15.9)
RBC.: 2.46 MIL/uL — ABNORMAL LOW (ref 3.87–5.11)
Retic Count, Absolute: 47.1 10*3/uL (ref 19.0–186.0)
Retic Ct Pct: 1.9 % (ref 0.4–3.1)

## 2018-06-18 LAB — ABO/RH: ABO/RH(D): O POS

## 2018-06-18 LAB — POC OCCULT BLOOD, ED: Fecal Occult Bld: POSITIVE — AB

## 2018-06-18 LAB — VITAMIN B12: Vitamin B-12: 364 pg/mL (ref 180–914)

## 2018-06-18 LAB — FOLATE: FOLATE: 9.9 ng/mL (ref >5.9)

## 2018-06-18 MED ORDER — ONDANSETRON HCL 4 MG/2ML IJ SOLN: 4.0000 mg | Freq: Four times a day (QID) | INTRAMUSCULAR | Status: DC | PRN

## 2018-06-18 MED ORDER — SODIUM CHLORIDE 0.9% IV SOLUTION: Freq: Once | INTRAVENOUS | Status: DC

## 2018-06-18 MED ORDER — DOCUSATE SODIUM 100 MG PO CAPS
100.0000 mg | ORAL_CAPSULE | Freq: Two times a day (BID) | ORAL | Status: DC
  Administered 2018-06-19 (×2): 100 mg via ORAL
  Filled 2018-06-18 (×3): qty 1

## 2018-06-18 MED ORDER — GABAPENTIN 100 MG PO CAPS
100.0000 mg | ORAL_CAPSULE | Freq: Four times a day (QID) | ORAL | Status: DC
  Administered 2018-06-19 – 2018-06-20 (×5): 100 mg via ORAL
  Filled 2018-06-18 (×5): qty 1

## 2018-06-18 MED ORDER — PAROXETINE HCL 20 MG PO TABS
30.0000 mg | ORAL_TABLET | Freq: Every day | ORAL | Status: DC
  Administered 2018-06-19 – 2018-06-20 (×2): 30 mg via ORAL
  Filled 2018-06-18 (×2): qty 1

## 2018-06-18 MED ORDER — PANTOPRAZOLE SODIUM 40 MG PO TBEC
40.0000 mg | DELAYED_RELEASE_TABLET | Freq: Every day | ORAL | Status: DC
  Administered 2018-06-19 – 2018-06-20 (×2): 40 mg via ORAL
  Filled 2018-06-18 (×2): qty 1

## 2018-06-18 MED ORDER — FUROSEMIDE 80 MG PO TABS
80.0000 mg | ORAL_TABLET | Freq: Every day | ORAL | Status: DC
  Administered 2018-06-19 – 2018-06-20 (×2): 80 mg via ORAL
  Filled 2018-06-18 (×2): qty 1

## 2018-06-18 MED ORDER — LEVOTHYROXINE SODIUM 75 MCG PO TABS
75.0000 ug | ORAL_TABLET | Freq: Every day | ORAL | Status: DC
  Administered 2018-06-19 – 2018-06-20 (×2): 75 ug via ORAL
  Filled 2018-06-18 (×2): qty 1

## 2018-06-18 MED ORDER — ACETAMINOPHEN 325 MG PO TABS: 650.0000 mg | ORAL_TABLET | Freq: Four times a day (QID) | ORAL | Status: DC | PRN

## 2018-06-18 MED ORDER — ONDANSETRON HCL 4 MG PO TABS: 4.0000 mg | ORAL_TABLET | Freq: Four times a day (QID) | ORAL | Status: DC | PRN

## 2018-06-18 MED ORDER — PRAVASTATIN SODIUM 10 MG PO TABS
20.0000 mg | ORAL_TABLET | Freq: Every day | ORAL | Status: DC
  Administered 2018-06-19: 20 mg via ORAL
  Filled 2018-06-18: qty 2

## 2018-06-18 MED ORDER — ACETAMINOPHEN 650 MG RE SUPP: 650.0000 mg | Freq: Four times a day (QID) | RECTAL | Status: DC | PRN

## 2018-06-18 MED ORDER — POTASSIUM CHLORIDE CRYS ER 20 MEQ PO TBCR
20.0000 meq | EXTENDED_RELEASE_TABLET | Freq: Every day | ORAL | Status: DC
Start: 2018-06-19 — End: 2018-06-20
  Administered 2018-06-19 – 2018-06-20 (×2): 20 meq via ORAL
  Filled 2018-06-18 (×2): qty 1

## 2018-06-18 MED ORDER — HYPROMELLOSE (GONIOSCOPIC) 2.5 % OP SOLN
1.0000 [drp] | Freq: Three times a day (TID) | OPHTHALMIC | Status: DC | PRN
  Filled 2018-06-18: qty 15

## 2018-06-18 MED ORDER — SODIUM CHLORIDE 0.9 % IV SOLN: 10.0000 mL/h | Freq: Once | INTRAVENOUS | Status: DC

## 2018-06-18 NOTE — ED Notes (Signed)
Called phleb for lab draw

## 2018-06-18 NOTE — ED Notes (Signed)
Date and time results received: 06/18/18 8:36 PM  (use smartphrase ".now" to insert current time)  Test: Hgb Critical Value: 5.7   Name of Provider Notified: Zackowski   Orders Received? Or Actions Taken?: see orders

## 2018-06-18 NOTE — ED Notes (Signed)
Pt to XR

## 2018-06-18 NOTE — ED Triage Notes (Signed)
Pt reports having abdominal pain with x4 BM today. Does have history of mild dementia. VSS. 20g right ac by EMS

## 2018-06-18 NOTE — ED Notes (Signed)
Patient in restrrom

## 2018-06-18 NOTE — H&P (Addendum)
History and Physical  Raven Moore WUJ:[id number] DOB: [birth year] DOA: 06/18/2018  Referring physician: Dr Rogene Houston, ED physician PCP: Renee Rival, NP  Outpatient Specialists:   Patient Coming From: home  Chief Complaint: abdominal pain  HPI: Raven Moore is a 84 y.o. female with a history of hypertension, hypothyroidism, atrial fibrillation on Eliquis, right-sided breast cancer.  Patient seen for abdominal pain x1 day that is been worsening.  In the urgency department, the patient had several stools, resolving the patient's abdominal pain.  The patient appeared pale to the EDP, labs were done and the patient was found to be anemic.  In conversation with the patient, the patient has been fatigued, mild dizziness, lightheadedness when standing and ambulating.  Has not noticed any black stools or frank blood in the stool.  Denies any other blood loss.  No palliating or provoking factors.  Emergency Department Course: Hemoglobin 5.7.  Hemoccult positive.  All of the labs relatively normal.  Review of Systems:   Pt denies any fevers, chills, nausea, vomiting, diarrhea, constipation, abdominal pain, shortness of breath, dyspnea on exertion, orthopnea, cough, wheezing, palpitations, headache, vision changes, melena, rectal bleeding.  Review of systems are otherwise negative  Past Medical History:  Diagnosis Date  . Atrial fibrillation ([place])   . Breast cancer ([place]) 1982   On the right.  . DJD (degenerative joint disease)   . High cholesterol   . Hypertension   . Hypothyroidism 06/06/2012   Past Surgical History:  Procedure Laterality Date  . ABDOMINAL HYSTERECTOMY     For fibroid tumors.  . Bilateral total hip replacements    . BREAST SURGERY    . Right-sided mastectomy  1982   Social History:  reports that she has never smoked. She has never used smokeless tobacco. She reports that she does not drink alcohol or use drugs. Patient lives at home  Allergies  Allergen  Reactions  . Novocain [Procaine Hcl] Rash    History reviewed. No pertinent family history.  History of hypothyroidism  Prior to Admission medications   Medication Sig Start Date End Date Taking? Authorizing Provider  apixaban (ELIQUIS) 2.5 MG TABS tablet Take 2.5 mg by mouth 2 (two) times daily.   Yes [provider]  calcium citrate (CALCITRATE - DOSED IN MG ELEMENTAL CALCIUM) 950 MG tablet Take 200 mg of elemental calcium by mouth daily.   Yes [provider]  carboxymethylcellulose (REFRESH PLUS) 0.5 % SOLN Place 1 drop into both eyes 3 (three) times daily as needed.   Yes [provider]  docusate sodium (COLACE) 100 MG capsule Take 100 mg by mouth 2 (two) times daily. Takes 1 capsule in the morning and 1 capsule at night   Yes [provider]  fish oil-omega-3 fatty acids 1000 MG capsule Take 1 g by mouth daily.   Yes [provider]  furosemide (LASIX) 40 MG tablet Take 1 tablet (40 mg total) by mouth 2 (two) times daily. Patient taking differently: Take 80 mg by mouth daily.  01/21/18  Yes Daleen Bo, MD  gabapentin (NEURONTIN) 100 MG capsule Take 100 mg by mouth 4 (four) times daily. Takes 2 capsules in the morning and 2 capsules at night   Yes [provider]  levothyroxine (SYNTHROID, LEVOTHROID) 75 MCG tablet Take 75 mcg by mouth daily.   Yes [provider]  lovastatin (MEVACOR) 20 MG tablet Take 20 mg by mouth at bedtime.   Yes [provider]  omeprazole (PRILOSEC) 20  MG capsule Take 20 mg by mouth daily.   Yes [provider]  PARoxetine (PAXIL) 20 MG tablet Take 30 mg by mouth daily.    Yes [provider]  Potassium Chloride ER 20 MEQ TBCR Take 20 mEq by mouth daily. 01/21/18  Yes Daleen Bo, MD  amoxicillin-clavulanate (AUGMENTIN) 875-125 MG tablet Take 1 tablet by mouth every 12 (twelve) hours. Patient not taking: Reported on 01/21/2018 08/22/17   Fransico Meadow, PA-C  furosemide  (LASIX) 40 MG tablet Take 40 mg by mouth 2 (two) times daily.     [provider]    Physical Exam: BP 120/62   Pulse 71   Temp 98.9 F (37.2 C) (Axillary)   Resp 18   Ht 5\' 4"  (1.626 m)   Wt 60 kg   LMP 06/06/2012   SpO2 100%   BMI 22.71 kg/m   . General: Elderly Caucasian female. Awake and alert and oriented x3. No acute cardiopulmonary distress.  Marland Kitchen HEENT: Normocephalic atraumatic.  Right and left ears normal in appearance.  Pupils equal, round, reactive to light. Extraocular muscles are intact. Sclerae anicteric and noninjected.  Moist mucosal membranes. No mucosal lesions.  . Neck: Neck supple without lymphadenopathy. No carotid bruits. No masses palpated.  . Cardiovascular: Regular rate with normal S1-S2 sounds. No murmurs, rubs, gallops auscultated. No JVD.  Marland Kitchen Respiratory: Good respiratory effort with no wheezes, rales, rhonchi. Lungs clear to auscultation bilaterally.  No accessory muscle use. . Abdomen: Soft, nontender, nondistended. Active bowel sounds. No masses or hepatosplenomegaly  . Skin: No rashes, lesions, or ulcerations.  Dry, warm to touch. 2+ dorsalis pedis and radial pulses. . Musculoskeletal: No calf or leg pain. All major joints not erythematous nontender.  No upper or lower joint deformation.  Good ROM.  No contractures  . Psychiatric: Intact judgment and insight. Pleasant and cooperative. . Neurologic: No focal neurological deficits. Strength is 5/5 and symmetric in upper and lower extremities.  Cranial nerves II through XII are grossly intact.           Labs on Admission: I have personally reviewed following labs and imaging studies  CBC: Recent Labs  Lab 06/18/18 2010  WBC 11.8*  NEUTROABS 10.4*  HGB 5.7*  HCT 20.8*  MCV 83.2  PLT 408*   Basic Metabolic Panel: Recent Labs  Lab 06/18/18 2010  NA 135  K 4.7  CL 102  CO2 24  GLUCOSE 142*  BUN 36*  CREATININE 1.13*  CALCIUM 10.1   GFR: Estimated Creatinine Clearance: 27.4 mL/min  (A) (by C-G formula based on SCr of 1.13 mg/dL (H)). Liver Function Tests: Recent Labs  Lab 06/18/18 2010  AST 27  ALT 13  ALKPHOS 52  BILITOT 0.3  PROT 6.8  ALBUMIN 3.4*   Recent Labs  Lab 06/18/18 2010  LIPASE 34   No results for input(s): AMMONIA in the last 168 hours. Coagulation Profile: No results for input(s): INR, PROTIME in the last 168 hours. Cardiac Enzymes: No results for input(s): CKTOTAL, CKMB, CKMBINDEX, TROPONINI in the last 168 hours. BNP (last 3 results) No results for input(s): PROBNP in the last 8760 hours. HbA1C: No results for input(s): HGBA1C in the last 72 hours. CBG: No results for input(s): GLUCAP in the last 168 hours. Lipid Profile: No results for input(s): CHOL, HDL, LDLCALC, TRIG, CHOLHDL, LDLDIRECT in the last 72 hours. Thyroid Function Tests: No results for input(s): TSH, T4TOTAL, FREET4, T3FREE, THYROIDAB in the last 72 hours. Anemia Panel: No results  for input(s): VITAMINB12, FOLATE, FERRITIN, TIBC, IRON, RETICCTPCT in the last 72 hours. Urine analysis:    Component Value Date/Time   COLORURINE STRAW (A) 01/21/2018 1430   APPEARANCEUR CLEAR 01/21/2018 1430   LABSPEC 1.009 01/21/2018 1430   PHURINE 7.0 01/21/2018 1430   GLUCOSEU NEGATIVE 01/21/2018 1430   HGBUR NEGATIVE 01/21/2018 1430   BILIRUBINUR NEGATIVE 01/21/2018 1430   KETONESUR NEGATIVE 01/21/2018 1430   PROTEINUR NEGATIVE 01/21/2018 1430   NITRITE NEGATIVE 01/21/2018 1430   LEUKOCYTESUR NEGATIVE 01/21/2018 1430   Sepsis Labs: @LABRCNTIP (procalcitonin:4,lacticidven:4) )No results found for this or any previous visit (from the past 240 hour(s)).   Radiological Exams on Admission: Dg Abd Acute W/chest  Result Date: 06/18/2018 CLINICAL DATA:  Abdominal pain with multiple bowel movements today. Dementia. EXAM: DG ABDOMEN ACUTE W/ 1V CHEST COMPARISON:  Chest radiography and chest CT 01/21/2018. FINDINGS: Bowel gas pattern does not show evidence of ileus or obstruction. There  is a large hiatal hernia. Moderate amount of fecal matter in the rectum. No abnormal soft tissue calcifications of significance. Old thoracolumbar compression fractures. Bilateral hip replacements. One-view chest shows cardiomegaly and aortic atherosclerosis. Large hiatal hernia as noted above. Chronic interstitial lung markings. No active process. IMPRESSION: Large hiatal hernia. Moderate amount of fecal matter in the rectum. Otherwise unremarkable abdominal films. Electronically Signed   By: Nelson Chimes M.D.   On: 06/18/2018 20:51    EKG: Independently reviewed.  Atrial fibrillation with normal rate.  Old anterior MI.  Flat T waves in lateral leads.  No acute ST changes.  Change from previous  Assessment/Plan: Principal Problem:   Symptomatic anemia Active Problems:   HTN (hypertension)   Hypothyroidism   Heme positive stool    This patient was discussed with the ED physician, including pertinent vitals, physical exam findings, labs, and imaging.  We also discussed care given by the ED provider.  1. Symptomatic anemia a. Observation b. Transfuse 3 units c. Discussed risks of transfusion including transfusion reaction, hemolytic anemia, infection with HIV/hepatitis. d. ? Slow leak vs inadequate production e. Check iron studies, L93, folic acid, retic count f. Check CBC in the morning 2. Heme positive stool a. Consult GI b. Hold Eliquis 3. Hypertension a. Continue patient regimen 4. Hypothyroidism a. Continue outpatient regimen  DVT prophylaxis: SCDs Consultants: GI Code Status: Full code Family Communication: Daughter in the room Disposition Plan: Patient should be able to return home following blood transfusion   Ahnesty Finfrock [place] [facility] Pager [phone]  If 7PM-7AM, please contact night-coverage [email or web address] Password [email or web address]

## 2018-06-18 NOTE — ED Notes (Signed)
EDP in room. Notified on positive occult

## 2018-06-18 NOTE — ED Notes (Signed)
Date and time results received: 06/18/18 9:01 PM  (use smartphrase ".now" to insert current time)  Test: Occult  Critical Value: positive   Name of Provider Notified: zackowski   Orders Received? Or Actions Taken?: see orders

## 2018-06-18 NOTE — ED Provider Notes (Signed)
[facility] Provider Note   CSN: [id number] Arrival date & time: 06/18/18  1911     History   Chief Complaint Chief Complaint  Patient presents with  . Abdominal Pain    HPI Raven Moore is a 84 y.o. female.  Patient brought in for daughter.  Patient with complaint of abdominal pain there is some question of chest pain but when asked when the point where the pain was patient pointed to the left side of her abdomen.  Patient is asymptomatic now.  She had 4 bowel movements at home and felt much better.  Denied any blood in the bowel movements.  Patient has a history of atrial fibrillation.  Patient is on Eliquis.  No syncope.     Past Medical History:  Diagnosis Date  . Atrial fibrillation ([place])   . Breast cancer ([place]) 1982   On the right.  . DJD (degenerative joint disease)   . High cholesterol   . Hypertension   . Hypothyroidism 06/06/2012    Patient Active Problem List   Diagnosis Date Noted  . Symptomatic anemia 06/18/2018  . Heme positive stool 06/18/2018  . CAP (community acquired pneumonia) 06/06/2012  . Anemia 06/06/2012  . Dehydration 06/06/2012  . HTN (hypertension) 06/06/2012  . Hypothyroidism 06/06/2012    Past Surgical History:  Procedure Laterality Date  . ABDOMINAL HYSTERECTOMY     For fibroid tumors.  . Bilateral total hip replacements    . BREAST SURGERY    . Right-sided mastectomy  1982     OB History    Gravida      Para      Term      Preterm      AB      Living  1     SAB      TAB      Ectopic      Multiple      Live Births               Home Medications    Prior to Admission medications   Medication Sig Start Date End Date Taking? Authorizing Provider  apixaban (ELIQUIS) 2.5 MG TABS tablet Take 2.5 mg by mouth 2 (two) times daily.   Yes [provider]  calcium citrate (CALCITRATE - DOSED IN MG ELEMENTAL CALCIUM) 950 MG tablet Take 200 mg of elemental calcium by mouth daily.    Yes [provider]  carboxymethylcellulose (REFRESH PLUS) 0.5 % SOLN Place 1 drop into both eyes 3 (three) times daily as needed.   Yes [provider]  docusate sodium (COLACE) 100 MG capsule Take 100 mg by mouth 2 (two) times daily. Takes 1 capsule in the morning and 1 capsule at night   Yes [provider]  fish oil-omega-3 fatty acids 1000 MG capsule Take 1 g by mouth daily.   Yes [provider]  furosemide (LASIX) 40 MG tablet Take 1 tablet (40 mg total) by mouth 2 (two) times daily. Patient taking differently: Take 80 mg by mouth daily.  01/21/18  Yes Daleen Bo, MD  gabapentin (NEURONTIN) 100 MG capsule Take 100 mg by mouth 4 (four) times daily. Takes 2 capsules in the morning and 2 capsules at night   Yes [provider]  levothyroxine (SYNTHROID, LEVOTHROID) 75 MCG tablet Take 75 mcg by mouth daily.   Yes [provider]  lovastatin (MEVACOR) 20 MG tablet Take 20 mg by mouth at bedtime.   Yes  [provider]  omeprazole (PRILOSEC) 20 MG capsule Take 20 mg by mouth daily.   Yes [provider]  PARoxetine (PAXIL) 20 MG tablet Take 30 mg by mouth daily.    Yes [provider]  Potassium Chloride ER 20 MEQ TBCR Take 20 mEq by mouth daily. 01/21/18  Yes Daleen Bo, MD  amoxicillin-clavulanate (AUGMENTIN) 875-125 MG tablet Take 1 tablet by mouth every 12 (twelve) hours. Patient not taking: Reported on 01/21/2018 08/22/17   Fransico Meadow, PA-C  furosemide (LASIX) 40 MG tablet Take 40 mg by mouth 2 (two) times daily.     [provider]    Family History History reviewed. No pertinent family history.  Social History Social History   Tobacco Use  . Smoking status: Never Smoker  . Smokeless tobacco: Never Used  Substance Use Topics  . Alcohol use: No  . Drug use: No     Allergies   Novocain [procaine hcl]   Review of Systems Review of Systems  Constitutional: Negative for diaphoresis  and fever.  HENT: Negative for congestion.   Eyes: Negative for redness.  Respiratory: Negative for shortness of breath.   Cardiovascular: Positive for chest pain.  Gastrointestinal: Positive for abdominal pain. Negative for blood in stool.  Genitourinary: Negative for dysuria.  Musculoskeletal: Negative for back pain.  Skin: Negative for rash.  Neurological: Negative for syncope.  Hematological: Does not bruise/bleed easily.  Psychiatric/Behavioral: Negative for confusion.     Physical Exam Updated Vital Signs BP (!) 98/40   Pulse 70   Temp 98.1 F (36.7 C) (Oral)   Resp 18   Ht 1.626 m (5\' 4" )   Wt 60 kg   LMP 06/06/2012   SpO2 93%   BMI 22.71 kg/m   Physical Exam  Constitutional: She appears well-developed and well-nourished. No distress.  HENT:  Head: Normocephalic and atraumatic.  Mouth/Throat: Oropharynx is clear and moist.  Eyes: Pupils are equal, round, and reactive to light. EOM are normal.  Sclera pale  Neck: Neck supple.  Cardiovascular: Normal rate, regular rhythm and normal heart sounds.  Pulmonary/Chest: Effort normal and breath sounds normal. No respiratory distress.  Abdominal: Soft. Bowel sounds are normal. There is no tenderness.  Genitourinary: Rectal exam shows guaiac positive stool.  Musculoskeletal: Normal range of motion.  Neurological: She is alert. No cranial nerve deficit or sensory deficit. She exhibits normal muscle tone. Coordination normal.  Skin: Skin is warm. No rash noted.  Nursing note and vitals reviewed.    ED Treatments / Results  Labs (all labs ordered are listed, but only abnormal results are displayed) Labs Reviewed  COMPREHENSIVE METABOLIC PANEL - Abnormal; Notable for the following components:      Result Value   Glucose, Bld 142 (*)    BUN 36 (*)    Creatinine, Ser 1.13 (*)    Albumin 3.4 (*)    GFR calc non Af Amer 42 (*)    GFR calc Af Amer 49 (*)    All other components within normal limits  CBC WITH  DIFFERENTIAL/PLATELET - Abnormal; Notable for the following components:   WBC 11.8 (*)    RBC 2.50 (*)    Hemoglobin 5.7 (*)    HCT 20.8 (*)    MCH 22.8 (*)    MCHC 27.4 (*)    RDW 20.1 (*)    Platelets 401 (*)    nRBC 0.4 (*)    Neutro Abs 10.4 (*)    Lymphs Abs 0.4 (*)  All other components within normal limits  IRON AND TIBC - Abnormal; Notable for the following components:   Iron 8 (*)    Saturation Ratios 2 (*)    All other components within normal limits  FERRITIN - Abnormal; Notable for the following components:   Ferritin 8 (*)    All other components within normal limits  RETICULOCYTES - Abnormal; Notable for the following components:   RBC. 2.46 (*)    All other components within normal limits  POC OCCULT BLOOD, ED - Abnormal; Notable for the following components:   Fecal Occult Bld POSITIVE (*)    All other components within normal limits  LIPASE, BLOOD  VITAMIN B12  FOLATE  I-STAT TROPONIN, ED  TYPE AND SCREEN  PREPARE RBC (CROSSMATCH)    EKG EKG Interpretation  Date/Time:  Friday June 18 2018 19:52:53 EST Ventricular Rate:  72 PR Interval:    QRS Duration: 88 QT Interval:  352 QTC Calculation: 386 R Axis:   103 Text Interpretation:  Atrial fibrillation Probable anterior infarct, age indeterminate Baseline wander in lead(s) V3 Artifact No significant change since last tracing Confirmed by Fredia Sorrow [id number]) on 06/18/2018 8:01:16 PM   Radiology Dg Abd Acute W/chest  Result Date: 06/18/2018 CLINICAL DATA:  Abdominal pain with multiple bowel movements today. Dementia. EXAM: DG ABDOMEN ACUTE W/ 1V CHEST COMPARISON:  Chest radiography and chest CT 01/21/2018. FINDINGS: Bowel gas pattern does not show evidence of ileus or obstruction. There is a large hiatal hernia. Moderate amount of fecal matter in the rectum. No abnormal soft tissue calcifications of significance. Old thoracolumbar compression fractures. Bilateral hip replacements. One-view chest  shows cardiomegaly and aortic atherosclerosis. Large hiatal hernia as noted above. Chronic interstitial lung markings. No active process. IMPRESSION: Large hiatal hernia. Moderate amount of fecal matter in the rectum. Otherwise unremarkable abdominal films. Electronically Signed   By: Nelson Chimes M.D.   On: 06/18/2018 20:51    Procedures Procedures (including critical care time)CRITICAL CARE Performed by: Fredia Sorrow Total critical care time: 30 minutes Critical care time was exclusive of separately billable procedures and treating other patients. Critical care was necessary to treat or prevent imminent or life-threatening deterioration. Critical care was time spent personally by me on the following activities: development of treatment plan with patient and/or surrogate as well as nursing, discussions with consultants, evaluation of patient's response to treatment, examination of patient, obtaining history from patient or surrogate, ordering and performing treatments and interventions, ordering and review of laboratory studies, ordering and review of radiographic studies, pulse oximetry and re-evaluation of patient's condition.   Medications Ordered in ED Medications  0.9 %  sodium chloride infusion (has no administration in time range)     Initial Impression / Assessment and Plan / ED Course  I have reviewed the triage vital signs and the nursing notes.  Pertinent labs & imaging results that were available during my care of the patient were reviewed by me and considered in my medical decision making (see chart for details).     Patient with significant anemia.  Requiring blood transfusion.  2 units of blood ordered.  Patient's stool is Hemoccult positive but not grossly melanotic or bloody in nature.  Stool was brown.  Patient's hemoglobin several months ago was in the 9 range.  So patient may have a slow GI blood loss or may not be making red blood cells as well as usual.  Patient's  abdominal pain resolved after having 4 bowel movements.  Abdominal x-ray  here today shows a little bit of of increased stool burden in the rectal area.  But no acute findings.  Discussed with hospitalist they will admit.  They will consult GI themselves.  Patient hemodynamically stable for admission.  Patient is also on Eliquis which can increase the likelihood of a slow GI bleed.  Stool here today not black in color or grossly bloody.  Final Clinical Impressions(s) / ED Diagnoses   Final diagnoses:  Anemia, unspecified type    ED Discharge Orders    None       Fredia Sorrow, MD 06/18/18 2227

## 2018-06-18 NOTE — ED Notes (Signed)
Lab in room to type and screen

## 2018-06-19 ENCOUNTER — Encounter (HOSPITAL_COMMUNITY): Payer: Self-pay | Admitting: Gastroenterology

## 2018-06-19 DIAGNOSIS — I482 Chronic atrial fibrillation, unspecified: Secondary | ICD-10-CM | POA: Diagnosis present

## 2018-06-19 DIAGNOSIS — D649 Anemia, unspecified: Secondary | ICD-10-CM | POA: Diagnosis not present

## 2018-06-19 DIAGNOSIS — R195 Other fecal abnormalities: Secondary | ICD-10-CM | POA: Diagnosis not present

## 2018-06-19 DIAGNOSIS — E039 Hypothyroidism, unspecified: Secondary | ICD-10-CM | POA: Diagnosis not present

## 2018-06-19 DIAGNOSIS — I10 Essential (primary) hypertension: Secondary | ICD-10-CM | POA: Diagnosis not present

## 2018-06-19 LAB — HEMOGLOBIN AND HEMATOCRIT, BLOOD
HCT: 31.9 % — ABNORMAL LOW (ref 36.0–46.0)
HEMOGLOBIN: 10 g/dL — AB (ref 12.0–15.0)

## 2018-06-19 LAB — PREPARE RBC (CROSSMATCH)

## 2018-06-19 MED ORDER — PEG 3350-KCL-NA BICARB-NACL 420 G PO SOLR
4000.0000 mL | Freq: Once | ORAL | Status: AC
  Administered 2018-06-19: 4000 mL via ORAL
  Filled 2018-06-19: qty 4000

## 2018-06-19 MED ORDER — BOOST / RESOURCE BREEZE PO LIQD CUSTOM
1.0000 | Freq: Three times a day (TID) | ORAL | Status: DC
  Administered 2018-06-19: 1 via ORAL

## 2018-06-19 MED ORDER — POLYVINYL ALCOHOL 1.4 % OP SOLN: 1.0000 [drp] | OPHTHALMIC | Status: DC | PRN

## 2018-06-19 NOTE — Progress Notes (Signed)
Blood about to start. Patient has electronic consent on chart.

## 2018-06-19 NOTE — Consult Note (Signed)
Referring Provider: No ref. provider found Primary Care Physician:  Strader, Lindsey F, NP Primary Gastroenterologist:  DR. Amiah Frohlich  Reason for Consultation:  ANEMIA   Impression: Admitted with IRON DEFICIENCY ANEMIA WHILE TAKING ELIQUIS FOR A FIB..  DIFFERENTIAL DIAGNOSIS INCLUDES: COLON POLYPS, AVMs, H PYLORI GASTRITIS, CAMERON'S EROSIONS,  ATROPHIC GASTRITIS, CELIAC DISEASE, OR LESS LIKELY COLON CANCER.  Plan: 1. Clear liquid diet. NPO AFTER MN EXCEPT SIPS WITH MEDS. PLAN FOR TCS/EGD W/ MAC ON DEC 8. DISCUSSED PROCEDURE, BENEFITS, & RISKS: < 1% chance of medication reaction, bleeding, perforation, or rupture of spleen/liver. 2. HOLD ELIQUIS. 3. PROTONIX DAILY. GREATER THAN 50% WAS SPENT IN COUNSELING & COORDINATION OF CARE WITH THE PATIENT: DISCUSSED DIFFERENTIAL DIAGNOSIS, PROCEDURE, BENEFITS, RISKS, AND MANAGEMENT OF IRON DEFICIENCY ANEMIA/OBSCURE GI BLEED. TOTAL ENCOUNTER TIME: 80 MINS.       HPI:  HISTORY OBTAINED PRIMARILY FROM THE DAUGHTER. PT HAS A TENDENCY TO BE CONSTIPATED IN 2019. SINCE AUG  2019 BEEN TAKING DULCOLAX AM AND PM BECAUSE SHE HAD AN ACUTE COMPRESSION FRACTURE(L4-5) AND WHILE SHE WAS THERE THEY GAVE HER GABAPETIN FOR PAIN AND PLACED HER ON ON DULCOLAX BID DUE TO STOOL IN COLON. WAS TOTALLY IMMOBILE AND NOW CAN GET UP AND WALK TO HER BED AND BATHROOM/CHAIR WITH ASSSITANCE. APPETITE BEEN GOOD. WEIGHT LOSS: NOT REALLY.  HAVING A COUPLE OF BM THAT CAME OUT WITHOUT HER CONTROL. ONE WAS JUST A BIT AGO. ONE WAS IN TH ED. BOTH WERE BROWN. CARE GIVER AND DAUGHTER DENY BLACK TARRY STOOL. DISCOVERED LOW HB WHEN SHE CAME TO ED. BROUGHT TO TH ED  BECAUSE SHE WAS HAVING ABDOMINAL PAIN IN SPIT OF BMs, DIDN'T FEEL GOOD, AND SHE WAS WHITE AS A SHEET. IT DIDN'T RESOLVE AND SHE CAME TO ED. PAIN MADE HER MOAN. BEEN ON ELIQUIS FOR MANY YEARS. [facility] TCS:???/NEVER EGD. HAD A LITTLE NAUSEA/VOMITING(NO BLOOD, LOOKS LIKE PHELGM)/SOB WHEN UP FOR A WHILE AND PHYSICALLY EXHAUSTED. NOTICED  SHE TUCKERS OUT EASILY OVER THE PAST WEEK. SOB ON EXERTION WAS  SUTTLE UNTIL YESTERDAY. HAS TROUBLE SWALLOWING BIG POTASSIUM PILLS.  PT DENIES FEVER, CHILLS, HEMATOCHEZIA, HEMATEMESIS, melena, diarrhea, CHEST PAIN, CHANGE IN BOWEL IN HABITS, problems swallowing, problems with sedation, OR heartburn or indigestion.  Past Medical History:  Diagnosis Date  . Atrial fibrillation ([facility])   . Breast cancer ([facility]) 1982   On the right.  . DJD (degenerative joint disease)   . High cholesterol   . Hypertension   . Hypothyroidism 06/06/2012    Past Surgical History:  Procedure Laterality Date  . ABDOMINAL HYSTERECTOMY     For fibroid tumors.  . Bilateral total hip replacements    . BREAST SURGERY    . Right-sided mastectomy  1982    Prior to Admission medications   Medication Sig Start Date End Date Taking? Authorizing Provider  apixaban (ELIQUIS) 2.5 MG TABS tablet Take 2.5 mg by mouth 2 (two) times daily.   Yes [provider]  calcium citrate (CALCITRATE - DOSED IN MG ELEMENTAL CALCIUM) 950 MG tablet Take 200 mg of elemental calcium by mouth daily.   Yes [provider]  carboxymethylcellulose (REFRESH PLUS) 0.5 % SOLN Place 1 drop into both eyes 3 (three) times daily as needed.   Yes [provider]  docusate sodium (COLACE) 100 MG capsule Take 100 mg by mouth 2 (two) times daily. Takes 1 capsule in the morning and 1 capsule at night   Yes [provider]  fish oil-omega-3 fatty acids 1000 MG capsule Take 1   g by mouth daily.   Yes [provider]  furosemide (LASIX) 40 MG tablet Take 1 tablet (40 mg total) by mouth 2 (two) times daily. Patient taking differently: Take 80 mg by mouth daily.  01/21/18  Yes Daleen Bo, MD  gabapentin (NEURONTIN) 100 MG capsule Take 100 mg by mouth 4 (four) times daily. Takes 2 capsules in the morning and 2 capsules at night   Yes [provider]  levothyroxine (SYNTHROID, LEVOTHROID) 75 MCG tablet Take 75  mcg by mouth daily.   Yes [provider]  lovastatin (MEVACOR) 20 MG tablet Take 20 mg by mouth at bedtime.   Yes [provider]  omeprazole (PRILOSEC) 20 MG capsule Take 20 mg by mouth daily.   Yes [provider]  PARoxetine (PAXIL) 20 MG tablet Take 30 mg by mouth daily.    Yes [provider]  Potassium Chloride ER 20 MEQ TBCR Take 20 mEq by mouth daily. 01/21/18  Yes Daleen Bo, MD  amoxicillin-clavulanate (AUGMENTIN) 875-125 MG tablet Take 1 tablet by mouth every 12 (twelve) hours. Patient not taking: Reported on 01/21/2018 08/22/17   Fransico Meadow, PA-C  furosemide (LASIX) 40 MG tablet Take 40 mg by mouth 2 (two) times daily.     [provider]    Current Facility-Administered Medications  Medication Dose Route Frequency     . 0.9 %  sodium chloride infusion (Manually program via Guardrails IV Fluids)   Intravenous Once     . 0.9 %  sodium chloride infusion  10 mL/hr Intravenous Once     . acetaminophen (TYLENOL) tablet 650 mg  650 mg Oral Q6H PRN      Or  . acetaminophen (TYLENOL) suppository 650 mg  650 mg Rectal Q6H PRN     . docusate sodium (COLACE) capsule 100 mg  100 mg Oral BID     . furosemide (LASIX) tablet 80 mg  80 mg Oral Daily     . gabapentin (NEURONTIN) capsule 100 mg  100 mg Oral QID     . levothyroxine (SYNTHROID, LEVOTHROID) tablet 75 mcg  75 mcg Oral Q0600     . ondansetron (ZOFRAN) tablet 4 mg  4 mg Oral Q6H PRN      Or  . ondansetron (ZOFRAN) injection 4 mg  4 mg Intravenous Q6H PRN     . pantoprazole (PROTONIX) EC tablet 40 mg  40 mg Oral Daily     . PARoxetine (PAXIL) tablet 30 mg  30 mg Oral Daily     . polyvinyl alcohol (LIQUIFILM TEARS) 1.4 % ophthalmic solution 1 drop  1 drop Both Eyes PRN     . potassium chloride SA (K-DUR,KLOR-CON) CR tablet 20 mEq  20 mEq Oral Daily     . pravastatin (PRAVACHOL) tablet 20 mg  20 mg Oral q1800        Allergies as of 06/18/2018 - Review Complete 06/18/2018  Allergen  Reaction Noted  . Novocain [procaine hcl] Rash 06/06/2012   Family History  Problem Relation Age of Onset  . Colon cancer Neg Hx   . Colon polyps Neg Hx    Social History   Socioeconomic History  . Marital status: Widowed    Spouse name: Not on file  . Number of children: Not on file  . Years of education: Not on file  . Highest education level: Not on file  Occupational History  . Not on file  Social Needs  . Financial resource strain: Not on  file  . Food insecurity:    Worry: Not on file    Inability: Not on file  . Transportation needs:    Medical: Not on file    Non-medical: Not on file  Tobacco Use  . Smoking status: Never Smoker  . Smokeless tobacco: Never Used  Substance and Sexual Activity  . Alcohol use: No  . Drug use: No  . Sexual activity: Never  Lifestyle  . Physical activity:    Days per week: Not on file    Minutes per session: Not on file  . Stress: Not on file  Relationships  . Social connections:    Talks on phone: Not on file    Gets together: Not on file    Attends religious service: Not on file    Active member of club or organization: Not on file    Attends meetings of clubs or organizations: Not on file    Relationship status: Not on file  Other Topics Concern  . Not on file  Social History Narrative   WIDOWED. UNTIL A FEW YEARS AGO WAS TAKING CARE OF HERSELF. 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 1 CHILD: DAUGHTER WHO LIVED IN [facility] AND MOVED BACK HOME TO TAKE CARE OF MOM. MOM HAS HER OWN APARTMENT.   Review of Systems: PER HPI OTHERWISE ALL SYSTEMS ARE NEGATIVE.  Vitals: Blood pressure 99/75, pulse 76, temperature 99.7 F (37.6 C), temperature source Oral, resp. rate 18, height _0  (1.626 m), weight 60 kg, last menstrual period 06/06/2012, SpO2 92 %.  Physical Exam: General:   Alert,  Well-developed, well-nourished, pleasant and cooperative in NAD Head:  Normocephalic and atraumatic. Eyes:  Sclera clear, no icterus.    Conjunctiva pink. Mouth:  No lesions, dentition ABnormal. Neck:  Supple; no masses. Lungs:  Clear throughout to auscultation.   No wheezes. No acute distress. Heart:  Regular rate and IRREGULAR rhythm; no murmurs. Abdomen:  Soft, nontender and nondistended. No masses noted. Normal bowel sounds, without guarding, and without rebound.   Msk:  Symmetrical with gross deformities.  Extremities:  Without edema. Neurologic:  Alert and  oriented x4; NO  NEW FOCAL DEFICITS Cervical Nodes:  No significant cervical adenopathy. Psych:  Alert and cooperative. Normal mood and affect.   Lab Results: Recent Labs    06/18/18 2010  WBC 11.8*  HGB 5.7*  HCT 20.8*  PLT 401*   BMET Recent Labs    06/18/18 2010  NA 135  K 4.7  CL 102  CO2 24  GLUCOSE 142*  BUN 36*  CREATININE 1.13*  CALCIUM 10.1   LFT Recent Labs    06/18/18 2010  PROT 6.8  ALBUMIN 3.4*  AST 27  ALT 13  ALKPHOS 52  BILITOT 0.3     Studies/Results: CT ANGIO JUL 2019: LARGE HH, CARDIOMEGALY, OSTEOPOROSIS WITH COMPRESSION FRACTURES.   LOS: 0 days   [facility]  06/19/2018, 10:42 AM

## 2018-06-19 NOTE — Progress Notes (Signed)
Clarified number of units of blood with MD on call.  Per md, patient should receive 3 units of blood.

## 2018-06-19 NOTE — H&P (View-Only) (Signed)
Referring Provider: No ref. provider found Primary Care Physician:  Renee Rival, NP Primary Gastroenterologist:  DR. Torres Hardenbrook  Reason for Consultation:  ANEMIA   Impression: Admitted with IRON DEFICIENCY ANEMIA WHILE TAKING ELIQUIS FOR A FIB.Marland Kitchen  DIFFERENTIAL DIAGNOSIS INCLUDES: COLON POLYPS, AVMs, H PYLORI GASTRITIS, CAMERON'S EROSIONS,  ATROPHIC GASTRITIS, CELIAC DISEASE, OR LESS LIKELY COLON CANCER.  Plan: 1. Clear liquid diet. NPO AFTER MN EXCEPT SIPS WITH MEDS. PLAN FOR TCS/EGD W/ MAC ON DEC 8. DISCUSSED PROCEDURE, BENEFITS, & RISKS: < 1% chance of medication reaction, bleeding, perforation, or rupture of spleen/liver. 2. HOLD [place]. 3. PROTONIX DAILY. GREATER THAN 50% WAS SPENT IN COUNSELING & COORDINATION OF CARE WITH THE PATIENT: DISCUSSED DIFFERENTIAL DIAGNOSIS, PROCEDURE, BENEFITS, RISKS, AND MANAGEMENT OF IRON DEFICIENCY ANEMIA/OBSCURE GI BLEED. TOTAL ENCOUNTER TIME: 27 MINS.       HPI:  HISTORY OBTAINED PRIMARILY FROM THE DAUGHTER. PT HAS A TENDENCY TO BE CONSTIPATED IN 2019. SINCE AUG  2019 BEEN TAKING DULCOLAX AM AND PM BECAUSE SHE HAD AN ACUTE COMPRESSION FRACTURE(L4-5) AND WHILE SHE WAS THERE THEY GAVE HER GABAPETIN FOR PAIN AND PLACED HER ON ON DULCOLAX BID DUE TO STOOL IN COLON. WAS TOTALLY IMMOBILE AND NOW CAN GET UP AND WALK TO HER BED AND BATHROOM/CHAIR WITH ASSSITANCE. APPETITE BEEN GOOD. WEIGHT LOSS: NOT REALLY.  HAVING A COUPLE OF BM THAT CAME OUT WITHOUT HER CONTROL. ONE WAS JUST A BIT AGO. ONE WAS IN [facility] ED. BOTH WERE BROWN. CARE GIVER AND DAUGHTER DENY BLACK TARRY STOOL. DISCOVERED LOW HB WHEN SHE CAME TO ED. BROUGHT TO [place] ED  BECAUSE SHE WAS HAVING ABDOMINAL PAIN IN SPIT OF BMs, DIDN'T FEEL GOOD, AND SHE WAS WHITE AS A SHEET. IT DIDN'T RESOLVE AND SHE CAME TO ED. PAIN MADE HER MOAN. BEEN ON ELIQUIS FOR MANY YEARS. [facility] TCS:???/NEVER EGD. HAD A LITTLE NAUSEA/VOMITING(NO BLOOD, LOOKS LIKE PHELGM)/SOB WHEN UP FOR A WHILE AND PHYSICALLY EXHAUSTED. NOTICED  SHE TUCKERS OUT EASILY OVER THE PAST WEEK. SOB ON EXERTION WAS  SUTTLE UNTIL YESTERDAY. HAS TROUBLE SWALLOWING BIG POTASSIUM PILLS.  PT DENIES FEVER, CHILLS, HEMATOCHEZIA, HEMATEMESIS, melena, diarrhea, CHEST PAIN, CHANGE IN BOWEL IN HABITS, problems swallowing, problems with sedation, OR heartburn or indigestion.  Past Medical History:  Diagnosis Date  . Atrial fibrillation ([place])   . Breast cancer ([place]) 1982   On the right.  . DJD (degenerative joint disease)   . High cholesterol   . Hypertension   . Hypothyroidism 06/06/2012    Past Surgical History:  Procedure Laterality Date  . ABDOMINAL HYSTERECTOMY     For fibroid tumors.  . Bilateral total hip replacements    . BREAST SURGERY    . Right-sided mastectomy  1982    Prior to Admission medications   Medication Sig Start Date End Date Taking? Authorizing Provider  apixaban (ELIQUIS) 2.5 MG TABS tablet Take 2.5 mg by mouth 2 (two) times daily.   Yes [provider]  calcium citrate (CALCITRATE - DOSED IN MG ELEMENTAL CALCIUM) 950 MG tablet Take 200 mg of elemental calcium by mouth daily.   Yes [provider]  carboxymethylcellulose (REFRESH PLUS) 0.5 % SOLN Place 1 drop into both eyes 3 (three) times daily as needed.   Yes [provider]  docusate sodium (COLACE) 100 MG capsule Take 100 mg by mouth 2 (two) times daily. Takes 1 capsule in the morning and 1 capsule at night   Yes [provider]  fish oil-omega-3 fatty acids 1000 MG capsule Take 1  g by mouth daily.   Yes [provider]  furosemide (LASIX) 40 MG tablet Take 1 tablet (40 mg total) by mouth 2 (two) times daily. Patient taking differently: Take 80 mg by mouth daily.  01/21/18  Yes Daleen Bo, MD  gabapentin (NEURONTIN) 100 MG capsule Take 100 mg by mouth 4 (four) times daily. Takes 2 capsules in the morning and 2 capsules at night   Yes [provider]  levothyroxine (SYNTHROID, LEVOTHROID) 75 MCG tablet Take 75  mcg by mouth daily.   Yes [provider]  lovastatin (MEVACOR) 20 MG tablet Take 20 mg by mouth at bedtime.   Yes [provider]  omeprazole (PRILOSEC) 20 MG capsule Take 20 mg by mouth daily.   Yes [provider]  PARoxetine (PAXIL) 20 MG tablet Take 30 mg by mouth daily.    Yes [provider]  Potassium Chloride ER 20 MEQ TBCR Take 20 mEq by mouth daily. 01/21/18  Yes Daleen Bo, MD  amoxicillin-clavulanate (AUGMENTIN) 875-125 MG tablet Take 1 tablet by mouth every 12 (twelve) hours. Patient not taking: Reported on 01/21/2018 08/22/17   Fransico Meadow, PA-C  furosemide (LASIX) 40 MG tablet Take 40 mg by mouth 2 (two) times daily.     [provider]    Current Facility-Administered Medications  Medication Dose Route Frequency     . 0.9 %  sodium chloride infusion (Manually program via Guardrails IV Fluids)   Intravenous Once     . 0.9 %  sodium chloride infusion  10 mL/hr Intravenous Once     . acetaminophen (TYLENOL) tablet 650 mg  650 mg Oral Q6H PRN      Or  . acetaminophen (TYLENOL) suppository 650 mg  650 mg Rectal Q6H PRN     . docusate sodium (COLACE) capsule 100 mg  100 mg Oral BID     . furosemide (LASIX) tablet 80 mg  80 mg Oral Daily     . gabapentin (NEURONTIN) capsule 100 mg  100 mg Oral QID     . levothyroxine (SYNTHROID, LEVOTHROID) tablet 75 mcg  75 mcg Oral Q0600     . ondansetron (ZOFRAN) tablet 4 mg  4 mg Oral Q6H PRN      Or  . ondansetron (ZOFRAN) injection 4 mg  4 mg Intravenous Q6H PRN     . pantoprazole (PROTONIX) EC tablet 40 mg  40 mg Oral Daily     . PARoxetine (PAXIL) tablet 30 mg  30 mg Oral Daily     . polyvinyl alcohol (LIQUIFILM TEARS) 1.4 % ophthalmic solution 1 drop  1 drop Both Eyes PRN     . potassium chloride SA (K-DUR,KLOR-CON) CR tablet 20 mEq  20 mEq Oral Daily     . pravastatin (PRAVACHOL) tablet 20 mg  20 mg Oral q1800        Allergies as of 06/18/2018 - Review Complete 06/18/2018  Allergen  Reaction Noted  . Novocain [procaine hcl] Rash 06/06/2012   Family History  Problem Relation Age of Onset  . Colon cancer Neg Hx   . Colon polyps Neg Hx    Social History   Socioeconomic History  . Marital status: Widowed    Spouse name: Not on file  . Number of children: Not on file  . Years of education: Not on file  . Highest education level: Not on file  Occupational History  . Not on file  Social Needs  . Financial resource strain: Not on  file  . Food insecurity:    Worry: Not on file    Inability: Not on file  . Transportation needs:    Medical: Not on file    Non-medical: Not on file  Tobacco Use  . Smoking status: Never Smoker  . Smokeless tobacco: Never Used  Substance and Sexual Activity  . Alcohol use: No  . Drug use: No  . Sexual activity: Never  Lifestyle  . Physical activity:    Days per week: Not on file    Minutes per session: Not on file  . Stress: Not on file  Relationships  . Social connections:    Talks on phone: Not on file    Gets together: Not on file    Attends religious service: Not on file    Active member of club or organization: Not on file    Attends meetings of clubs or organizations: Not on file    Relationship status: Not on file  Other Topics Concern  . Not on file  Social History Narrative   WIDOWED. UNTIL A FEW YEARS AGO WAS TAKING CARE OF HERSELF. 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 1 CHILD: DAUGHTER WHO LIVED IN [facility] AND MOVED BACK HOME TO TAKE CARE OF MOM. MOM HAS HER OWN APARTMENT.   Review of Systems: PER HPI OTHERWISE ALL SYSTEMS ARE NEGATIVE.  Vitals: Blood pressure 99/75, pulse 76, temperature 99.7 F (37.6 C), temperature source Oral, resp. rate 18, height _0  (1.626 m), weight 60 kg, last menstrual period 06/06/2012, SpO2 92 %.  Physical Exam: General:   Alert,  Well-developed, well-nourished, pleasant and cooperative in NAD Head:  Normocephalic and atraumatic. Eyes:  Sclera clear, no icterus.    Conjunctiva pink. Mouth:  No lesions, dentition ABnormal. Neck:  Supple; no masses. Lungs:  Clear throughout to auscultation.   No wheezes. No acute distress. Heart:  Regular rate and IRREGULAR rhythm; no murmurs. Abdomen:  Soft, nontender and nondistended. No masses noted. Normal bowel sounds, without guarding, and without rebound.   Msk:  Symmetrical with gross deformities.  Extremities:  Without edema. Neurologic:  Alert and  oriented x4; NO  NEW FOCAL DEFICITS Cervical Nodes:  No significant cervical adenopathy. Psych:  Alert and cooperative. Normal mood and affect.   Lab Results: Recent Labs    06/18/18 2010  WBC 11.8*  HGB 5.7*  HCT 20.8*  PLT 401*   BMET Recent Labs    06/18/18 2010  NA 135  K 4.7  CL 102  CO2 24  GLUCOSE 142*  BUN 36*  CREATININE 1.13*  CALCIUM 10.1   LFT Recent Labs    06/18/18 2010  PROT 6.8  ALBUMIN 3.4*  AST 27  ALT 13  ALKPHOS 52  BILITOT 0.3     Studies/Results: CT ANGIO JUL 2019: LARGE HH, CARDIOMEGALY, OSTEOPOROSIS WITH COMPRESSION FRACTURES.   LOS: 0 days   Acadia Montana  06/19/2018, 10:42 AM

## 2018-06-19 NOTE — Progress Notes (Signed)
PROGRESS NOTE    Raven Moore  ZSW:[id number] DOB: 05/08/1924 DOA: 06/18/2018 PCP: Renee Rival, NP    Brief Narrative:  84 year old female admitted to the hospital with abdominal pain.  She has a history of chronic atrial fibrillation and is on Eliquis.  In the emergency room, she was found to be significantly anemic with a hemoglobin of 5.7.  She also had heme positive stools.  She was admitted for further treatments.   Assessment & Plan:   Principal Problem:   Symptomatic anemia Active Problems:   HTN (hypertension)   Hypothyroidism   Heme positive stool   1. Iron deficiency anemia secondary to GI blood loss.  Admission hemoglobin was low at 5.7.  She was transfused 3 units of PRBC.  Follow-up hemoglobin today is noted to be 10.  No ongoing signs of bleeding.  Continue to monitor. 2. Heme positive stools.  GI following and plans on EGD/colonoscopy tomorrow.  Eliquis is currently on hold.  Continue on Protonix 3. Chronic atrial fibrillation.  Heart rate is currently stable.  Anticoagulation currently on hold due to #1. 4. Hypothyroidism.  Continue on Synthroid 5. Hypertension.  Continue outpatient regimen.  DVT prophylaxis: SCDs Code Status: Full code Family Communication: Discussed with family at the bedside Disposition Plan: Discharge home once GI work-up is complete   Consultants:   Gastroenterology  Procedures:     Antimicrobials:       Subjective: No abdominal pain, nausea or vomiting  Objective: Vitals:   06/19/18 1014 06/19/18 1042 06/19/18 1302 06/19/18 1449  BP: (!) 113/57 99/75 (!) 106/49 110/60  Pulse: 77 76 (!) 128 72  Resp: 16 18 16 17   Temp: 98.1 F (36.7 C) 99.7 F (37.6 C) 100.3 F (37.9 C) 98.5 F (36.9 C)  TempSrc: Oral Oral Oral   SpO2: 90% 92% 94% 90%  Weight:      Height:        Intake/Output Summary (Last 24 hours) at 06/19/2018 2008 Last data filed at 06/19/2018 1300 Gross per 24 hour  Intake 1454 ml  Output -  Net  1454 ml   Filed Weights   06/18/18 1916  Weight: 60 kg    Examination:  General exam: Appears calm and comfortable  Respiratory system: Clear to auscultation. Respiratory effort normal. Cardiovascular system: S1 & S2 heard, RRR. No JVD, murmurs, rubs, gallops or clicks. No pedal edema. Gastrointestinal system: Abdomen is nondistended, soft and nontender. No organomegaly or masses felt. Normal bowel sounds heard. Central nervous system:  No focal neurological deficits. Extremities: Symmetric 5 x 5 power. Skin: No rashes, lesions or ulcers Psychiatry: Mild confusion, otherwise pleasant    Data Reviewed: I have personally reviewed following labs and imaging studies  CBC: Recent Labs  Lab 06/18/18 2010 06/19/18 1711  WBC 11.8*  --   NEUTROABS 10.4*  --   HGB 5.7* 10.0*  HCT 20.8* 31.9*  MCV 83.2  --   PLT 401*  --    Basic Metabolic Panel: Recent Labs  Lab 06/18/18 2010  NA 135  K 4.7  CL 102  CO2 24  GLUCOSE 142*  BUN 36*  CREATININE 1.13*  CALCIUM 10.1   GFR: Estimated Creatinine Clearance: 27.4 mL/min (A) (by C-G formula based on SCr of 1.13 mg/dL (H)). Liver Function Tests: Recent Labs  Lab 06/18/18 2010  AST 27  ALT 13  ALKPHOS 52  BILITOT 0.3  PROT 6.8  ALBUMIN 3.4*   Recent Labs  Lab 06/18/18 2010  LIPASE  34   No results for input(s): AMMONIA in the last 168 hours. Coagulation Profile: No results for input(s): INR, PROTIME in the last 168 hours. Cardiac Enzymes: No results for input(s): CKTOTAL, CKMB, CKMBINDEX, TROPONINI in the last 168 hours. BNP (last 3 results) No results for input(s): PROBNP in the last 8760 hours. HbA1C: No results for input(s): HGBA1C in the last 72 hours. CBG: No results for input(s): GLUCAP in the last 168 hours. Lipid Profile: No results for input(s): CHOL, HDL, LDLCALC, TRIG, CHOLHDL, LDLDIRECT in the last 72 hours. Thyroid Function Tests: No results for input(s): TSH, T4TOTAL, FREET4, T3FREE, THYROIDAB in  the last 72 hours. Anemia Panel: Recent Labs    06/18/18 2010 06/18/18 2106  VITAMINB12 364  --   FOLATE  --  9.9  FERRITIN 8*  --   TIBC 433  --   IRON 8*  --   RETICCTPCT 1.9  --    Sepsis Labs: No results for input(s): PROCALCITON, LATICACIDVEN in the last 168 hours.  No results found for this or any previous visit (from the past 240 hour(s)).       Radiology Studies: Dg Abd Acute W/chest  Result Date: 06/18/2018 CLINICAL DATA:  Abdominal pain with multiple bowel movements today. Dementia. EXAM: DG ABDOMEN ACUTE W/ 1V CHEST COMPARISON:  Chest radiography and chest CT 01/21/2018. FINDINGS: Bowel gas pattern does not show evidence of ileus or obstruction. There is a large hiatal hernia. Moderate amount of fecal matter in the rectum. No abnormal soft tissue calcifications of significance. Old thoracolumbar compression fractures. Bilateral hip replacements. One-view chest shows cardiomegaly and aortic atherosclerosis. Large hiatal hernia as noted above. Chronic interstitial lung markings. No active process. IMPRESSION: Large hiatal hernia. Moderate amount of fecal matter in the rectum. Otherwise unremarkable abdominal films. Electronically Signed   By: Nelson Chimes M.D.   On: 06/18/2018 20:51        Scheduled Meds: . sodium chloride   Intravenous Once  . docusate sodium  100 mg Oral BID  . feeding supplement  1 Container Oral TID BM  . furosemide  80 mg Oral Daily  . gabapentin  100 mg Oral QID  . levothyroxine  75 mcg Oral Q0600  . pantoprazole  40 mg Oral Daily  . PARoxetine  30 mg Oral Daily  . potassium chloride SA  20 mEq Oral Daily  . pravastatin  20 mg Oral q1800   Continuous Infusions: . sodium chloride       LOS: 0 days    Time spent: 29mins    Kathie Dike, MD [facility] Pager [phone]  If 7PM-7AM, please contact night-coverage [email or web address] Password [email or web address] 06/19/2018, 8:08 PM

## 2018-06-20 ENCOUNTER — Observation Stay (HOSPITAL_COMMUNITY): Payer: Medicare Other | Admitting: Anesthesiology

## 2018-06-20 ENCOUNTER — Other Ambulatory Visit: Payer: Self-pay

## 2018-06-20 ENCOUNTER — Encounter (HOSPITAL_COMMUNITY): Payer: Self-pay

## 2018-06-20 ENCOUNTER — Encounter (HOSPITAL_COMMUNITY)
Admission: EM | Disposition: A | Discharge status: Home / Self Care | Payer: Self-pay | Source: Home / Self Care | Attending: Emergency Medicine

## 2018-06-20 DIAGNOSIS — K449 Diaphragmatic hernia without obstruction or gangrene: Secondary | ICD-10-CM

## 2018-06-20 DIAGNOSIS — R109 Unspecified abdominal pain: Secondary | ICD-10-CM | POA: Diagnosis not present

## 2018-06-20 DIAGNOSIS — K2970 Gastritis, unspecified, without bleeding: Secondary | ICD-10-CM | POA: Diagnosis not present

## 2018-06-20 DIAGNOSIS — K529 Noninfective gastroenteritis and colitis, unspecified: Secondary | ICD-10-CM | POA: Diagnosis not present

## 2018-06-20 DIAGNOSIS — K633 Ulcer of intestine: Secondary | ICD-10-CM

## 2018-06-20 DIAGNOSIS — K319 Disease of stomach and duodenum, unspecified: Secondary | ICD-10-CM | POA: Diagnosis not present

## 2018-06-20 DIAGNOSIS — D649 Anemia, unspecified: Secondary | ICD-10-CM | POA: Diagnosis not present

## 2018-06-20 DIAGNOSIS — E039 Hypothyroidism, unspecified: Secondary | ICD-10-CM | POA: Diagnosis not present

## 2018-06-20 DIAGNOSIS — I10 Essential (primary) hypertension: Secondary | ICD-10-CM | POA: Diagnosis not present

## 2018-06-20 DIAGNOSIS — R195 Other fecal abnormalities: Secondary | ICD-10-CM | POA: Diagnosis not present

## 2018-06-20 DIAGNOSIS — D509 Iron deficiency anemia, unspecified: Secondary | ICD-10-CM | POA: Diagnosis not present

## 2018-06-20 DIAGNOSIS — I482 Chronic atrial fibrillation, unspecified: Secondary | ICD-10-CM | POA: Diagnosis not present

## 2018-06-20 HISTORY — PX: COLONOSCOPY WITH PROPOFOL: SHX5780

## 2018-06-20 HISTORY — PX: ESOPHAGOGASTRODUODENOSCOPY (EGD) WITH PROPOFOL: SHX5813

## 2018-06-20 HISTORY — PX: BIOPSY: SHX5522

## 2018-06-20 LAB — TYPE AND SCREEN
ABO/RH(D): O POS
Antibody Screen: NEGATIVE
UNIT DIVISION: 0
UNIT DIVISION: 0
Unit division: 0

## 2018-06-20 LAB — BPAM RBC
BLOOD PRODUCT EXPIRATION DATE: 201912252359
Blood Product Expiration Date: 201912252359
Blood Product Expiration Date: 201912252359
ISSUE DATE / TIME: 201912070001
ISSUE DATE / TIME: 201912070428
ISSUE DATE / TIME: 201912071022
Unit Type and Rh: 5100
Unit Type and Rh: 5100
Unit Type and Rh: 5100

## 2018-06-20 LAB — CBC
HEMATOCRIT: 31.7 % — AB (ref 36.0–46.0)
Hemoglobin: 9.8 g/dL — ABNORMAL LOW (ref 12.0–15.0)
MCH: 26 pg (ref 26.0–34.0)
MCHC: 30.9 g/dL (ref 30.0–36.0)
MCV: 84.1 fL (ref 80.0–100.0)
Platelets: 276 10*3/uL (ref 150–400)
RBC: 3.77 MIL/uL — ABNORMAL LOW (ref 3.87–5.11)
RDW: 17.4 % — ABNORMAL HIGH (ref 11.5–15.5)
WBC: 8.5 10*3/uL (ref 4.0–10.5)
nRBC: 0.9 % — ABNORMAL HIGH (ref 0.0–0.2)

## 2018-06-20 SURGERY — COLONOSCOPY WITH PROPOFOL
Anesthesia: Monitor Anesthesia Care

## 2018-06-20 MED ORDER — LACTATED RINGERS IV SOLN: INTRAVENOUS | Status: DC

## 2018-06-20 MED ORDER — HYDROCODONE-ACETAMINOPHEN 7.5-325 MG PO TABS: 1.0000 | ORAL_TABLET | Freq: Once | ORAL | Status: DC | PRN

## 2018-06-20 MED ORDER — FUROSEMIDE 40 MG PO TABS: 80.0000 mg | ORAL_TABLET | Freq: Every day | ORAL | Status: DC

## 2018-06-20 MED ORDER — SPOT INK MARKER SYRINGE KIT
PACK | SUBMUCOSAL | Status: AC
  Filled 2018-06-20: qty 5

## 2018-06-20 MED ORDER — HYDROMORPHONE HCL 1 MG/ML IJ SOLN: 0.2500 mg | INTRAMUSCULAR | Status: DC | PRN

## 2018-06-20 MED ORDER — PROMETHAZINE HCL 25 MG/ML IJ SOLN: 6.2500 mg | INTRAMUSCULAR | Status: DC | PRN

## 2018-06-20 MED ORDER — PROPOFOL 10 MG/ML IV BOLUS
INTRAVENOUS | Status: AC
  Filled 2018-06-20: qty 20

## 2018-06-20 MED ORDER — MEPERIDINE HCL 100 MG/ML IJ SOLN: 6.2500 mg | INTRAMUSCULAR | Status: DC | PRN

## 2018-06-20 MED ORDER — PROPOFOL 10 MG/ML IV BOLUS
INTRAVENOUS | Status: DC | PRN
  Administered 2018-06-20: 10 mg via INTRAVENOUS
  Administered 2018-06-20: 20 mg via INTRAVENOUS
  Administered 2018-06-20 (×3): 10 mg via INTRAVENOUS
  Administered 2018-06-20: 30 mg via INTRAVENOUS
  Administered 2018-06-20 (×2): 20 mg via INTRAVENOUS
  Administered 2018-06-20 (×2): 10 mg via INTRAVENOUS
  Administered 2018-06-20: 20 mg via INTRAVENOUS
  Administered 2018-06-20 (×3): 10 mg via INTRAVENOUS
  Administered 2018-06-20 (×2): 20 mg via INTRAVENOUS
  Administered 2018-06-20 (×3): 10 mg via INTRAVENOUS
  Administered 2018-06-20 (×2): 20 mg via INTRAVENOUS
  Administered 2018-06-20 (×2): 10 mg via INTRAVENOUS
  Administered 2018-06-20: 20 mg via INTRAVENOUS
  Administered 2018-06-20 (×2): 10 mg via INTRAVENOUS

## 2018-06-20 MED ORDER — SPOT INK MARKER SYRINGE KIT
PACK | SUBMUCOSAL | Status: DC | PRN
  Administered 2018-06-20: 3 mL via SUBMUCOSAL

## 2018-06-20 NOTE — Progress Notes (Signed)
Patient was given enemas per order.  Patient had liquid stool that was light brownish in color.

## 2018-06-20 NOTE — Transfer of Care (Signed)
Immediate Anesthesia Transfer of Care Note  Patient: Darylene Price Wienke  Procedure(s) Performed: COLONOSCOPY WITH PROPOFOL (N/A ) ESOPHAGOGASTRODUODENOSCOPY (EGD) WITH PROPOFOL (N/A ) BIOPSY  Patient Location: PACU  Anesthesia Type:MAC  Level of Consciousness: awake, alert , oriented and sedated  Airway & Oxygen Therapy: Patient Spontanous Breathing and Patient connected to nasal cannula oxygen  Post-op Assessment: Report given to RN, Post -op Vital signs reviewed and stable and Patient moving all extremities X 4  Post vital signs: Reviewed and stable  Last Vitals:  Vitals Value Taken Time  BP    Temp    Pulse    Resp    SpO2      Last Pain:  Vitals:   06/20/18 0903  TempSrc: Oral  PainSc:          Complications: No apparent anesthesia complications

## 2018-06-20 NOTE — Progress Notes (Signed)
Patient is to be discharged home and in stable condition. Patient's IV removed, WNL. Patient and daughter given discharge instructions and verbalized understanding. Patient to be escorted out by staff via wheelchair.  Celestia Khat, RN

## 2018-06-20 NOTE — Op Note (Signed)
[facility] Patient Name: Raven Moore Procedure Date: 06/20/2018 10:13 AM MRN: [id number] Date of Birth: 1923-08-31 Attending MD: Barney Drain MD, MD CSN: [id number] Age: 84 Admit Type: Inpatient Procedure:                Upper GI endoscopy WITH COLD FORCEPS BIOPSY Indications:              Iron deficiency anemia Providers:                Barney Drain MD, MD, Janeece Riggers, RN, Nelma Rothman,                            Technician Referring MD:             Renee Rival Medicines:                Propofol per Anesthesia Complications:            No immediate complications. Estimated Blood Loss:     Estimated blood loss was minimal. Procedure:                Pre-Anesthesia Assessment:                           - Prior to the procedure, a History and Physical                            was performed, and patient medications and                            allergies were reviewed. The patient's tolerance of                            previous anesthesia was also reviewed. The risks                            and benefits of the procedure and the sedation                            options and risks were discussed with the patient.                            All questions were answered, and informed consent                            was obtained. Prior Anticoagulants: The patient has                            taken Eliquis (apixaban), last dose was 3 days                            prior to procedure. ASA Grade Assessment: II - A                            patient with mild systemic disease. After reviewing  the risks and benefits, the patient was deemed in                            satisfactory condition to undergo the procedure.                            After obtaining informed consent, the endoscope was                            passed under direct vision. Throughout the                            procedure, the patient's blood pressure, pulse, and                        oxygen saturations were monitored continuously. The                            [id number]-[id number] ([id number]) scope was introduced through the                            mouth, and advanced to the second part of duodenum.                            The upper GI endoscopy was accomplished without                            difficulty. The patient tolerated the procedure                            well. Scope In: 10:19:29 AM Scope Out: 10:23:31 AM Total Procedure Duration: 0 hours 4 minutes 2 seconds  Findings:      The examined esophagus was normal.      A large hiatal hernia was present.      Segmental mild inflammation characterized by congestion (edema) and       erythema was found in the gastric body and in the gastric antrum.       Biopsies were taken with a cold forceps for Helicobacter pylori testing.      The examined duodenum was normal. Impression:               - IRON DEFICIENCY ANEMIA DUE TO LESIONS IN LEFT                            COLON: ISCHEMIA V. NEOPLASM.                           - Large hiatal hernia.                           - MILD Gastritis. Biopsied. Moderate Sedation:      Per Anesthesia Care Recommendation:           - Return patient to hospital ward for ongoing care.                           -  Soft diet.                           - Continue present medications. HOLD ELIQUIS.                            PROTONIX ONCE DAILY.                           - Await pathology results.                           - Return to GI office after studies are complete. Procedure Code(s):        --- Professional ---                           [phone], Esophagogastroduodenoscopy, flexible,                            transoral; with biopsy, single or multiple Diagnosis Code(s):        --- Professional ---                           K44.9, Diaphragmatic hernia without obstruction or                            gangrene                           K29.70, Gastritis, unspecified,  without bleeding                           D50.9, Iron deficiency anemia, unspecified CPT copyright 2018 [employer] Association. All rights reserved. The codes documented in this report are preliminary and upon coder review may  be revised to meet current compliance requirements. Barney Drain, MD Barney Drain MD, MD 06/20/2018 10:44:14 AM This report has been signed electronically. Number of Addenda: 0

## 2018-06-20 NOTE — Op Note (Addendum)
[facility] Patient Name: Raven Moore Procedure Date: 06/20/2018 8:58 AM MRN: [id number] Date of Birth: 1924/01/13 Attending MD: Barney Drain MD, MD CSN: [id number] Age: 84 Admit Type: Inpatient Procedure:                Colonoscopy WITH COLD FORCEPS BIOPSY/SPOT TATTOO Indications:              Iron deficiency anemia ON ELIQUIS FOR A FIB. Providers:                Barney Drain MD, MD, Janeece Riggers, RN, Nelma Rothman,                            Technician Referring MD:             Renee Rival Medicines:                Propofol per Anesthesia Complications:            No immediate complications. Estimated Blood Loss:     Estimated blood loss was minimal                           . Procedure:                Pre-Anesthesia Assessment:                           - Prior to the procedure, a History and Physical                            was performed, and patient medications and                            allergies were reviewed. The patient's tolerance of                            previous anesthesia was also reviewed. The risks                            and benefits of the procedure and the sedation                            options and risks were discussed with the patient.                            All questions were answered, and informed consent                            was obtained. Prior Anticoagulants: The patient has                            taken Eliquis (apixaban), last dose was 3 days                            prior to procedure. ASA Grade Assessment: II - A  patient with mild systemic disease. After reviewing                            the risks and benefits, the patient was deemed in                            satisfactory condition to undergo the procedure.                            After obtaining informed consent, the colonoscope                            was passed under direct vision. Throughout the                             procedure, the patient's blood pressure, pulse, and                            oxygen saturations were monitored continuously. The                            [id number]-[id number] ([id number]) scope was introduced through                            the anus and advanced to the 10 cm into the ileum.                            The colonoscopy was technically difficult and                            complex due to restricted mobility of the colon and                            a tortuous colon. Successful completion of the                            procedure was aided by straightening and shortening                            the scope to obtain bowel loop reduction and                            COLOWRAP. The patient tolerated the procedure well.                            The quality of the bowel preparation was good. The                            ileocecal valve, appendiceal orifice, and rectum                            were photographed. Scope In: 9:28:30 AM Scope Out: 10:13:03 AM Scope Withdrawal Time: 0 hours 37  minutes 44 seconds  Total Procedure Duration: 0 hours 44 minutes 33 seconds  Findings:      The terminal ileum appeared normal.      An ulcerated non-obstructing medium-sized mass was found at the splenic       flexure. The mass was partially circumferential (involving one-third of       the lumen circumference). Oozing was present. This was biopsied with a       cold jumbo forceps for histology. Area was tattooed with an injection of       2 mL of Spot (carbon black)(1 CC PROXIMAL TO THE LESION AND 1 CC DISTAL       TO THE LESION).      Segmental mild inflammation characterized by congestion (edema),       erosions, erythema and mucus was found in the descending colon. Biopsies       were taken with a cold forceps for histology. Area was tattooed(~ 45 CM       FROM THE ANAL VERGE) with an injection of 1 mL of Spot (carbon black).      A continuous area of nonbleeding ulcerated mucosa  with no stigmata of       recent bleeding was present in the sigmoid colon. Biopsies were taken       with a cold forceps for histology. Impression:               - IRON DEFICIENCY ANEMIA DUE TO LESIONS IN LEFT                            COLON: ISCHEMIC V. NEOPLASM Moderate Sedation:      Per Anesthesia Care Recommendation:           - Return patient to hospital ward for ongoing care.                           - Soft diet.                           - Continue present medications. HOLD ELIQUIS.                           - Await pathology results. IF ISCHEMIC cOLITIS, PT                            NEEDS CTA AND WILL RE-START ELIQUIS AND ADD FES04.                            IF NEOPLASM, PROCEED WITH STAGING CT AND SURGICAL                            REFERRAL.                           - Repeat colonoscopy is not recommended due to                            current age (45 years or older) for surveillance. Procedure Code(s):        --- Professional ---  45381, Colonoscopy, flexible; with directed                            submucosal injection(s), any substance                           45380, Colonoscopy, flexible; with biopsy, single                            or multiple Diagnosis Code(s):        --- Professional ---                           D49.0, Neoplasm of unspecified behavior of                            digestive system                           K52.9, Noninfective gastroenteritis and colitis,                            unspecified                           K63.3, Ulcer of intestine                           D50.9, Iron deficiency anemia, unspecified CPT copyright 2018 American Medical Association. All rights reserved. The codes documented in this report are preliminary and upon coder review may  be revised to meet current compliance requirements. Barney Drain, MD Barney Drain MD, MD 06/20/2018 10:39:04 AM This report has been signed  electronically. Number of Addenda: 0

## 2018-06-20 NOTE — Interval H&P Note (Signed)
History and Physical Interval Note:  06/20/2018 9:02 AM  Raven Moore  has presented today for surgery, with the diagnosis of ANEMIA, IRON DEFICIENCY  The various methods of treatment have been discussed with the patient and family. After consideration of risks, benefits and other options for treatment, the patient has consented to  Procedure(s): COLONOSCOPY WITH PROPOFOL (N/A) ESOPHAGOGASTRODUODENOSCOPY (EGD) WITH PROPOFOL (N/A) as a surgical intervention .  The patient's history has been reviewed, patient examined, no change in status, stable for surgery.  I have reviewed the patient's chart and labs.  Questions were answered to the patient's satisfaction.     [employer]

## 2018-06-20 NOTE — Discharge Summary (Signed)
Physician Discharge Summary  Raven Moore FUX:[id number] DOB: [birth year] DOA: 06/18/2018  PCP: Renee Rival, NP  Admit date: 06/18/2018 Discharge date: 06/20/2018  Admitted From: home Disposition:  home  Recommendations for Outpatient Follow-up:  1. Follow up with PCP in 1-2 weeks 2. Please obtain BMP/CBC in one week 3. Patient will follow-up with GI for biopsy results and will arrange for further imaging as needed including CT of the abdomen.  Discharge Condition: Stable CODE STATUS: Full code Diet recommendation: Heart healthy  Brief/Interim Summary: 84 year old female with a history of chronic atrial fibrillation on anticoagulation, was brought to the hospital with abdominal pain.  She was found to have a hemoglobin of 5.7 and heme positive stools.  She was transfused a total of 3 units of PRBCs.  Follow-up hemoglobin had improved to 10.  She did not have any gross evidence of bleeding.  Eliquis was held on admission.  She was seen by GI and underwent endoscopy as well as colonoscopy.  The studies revealed a left colon lesion, unclear if it was ischemic versus neoplastic.  Biopsies were obtained.  If lesion is ischemic, she will need CT Angio of her abdomen and pelvis.  If biopsy shows neoplastic process, she will need CT chest and abdomen for staging as well as referral to surgery/oncology.  GI service will coordinate this work-up as an outpatient.  She is been cleared for discharge.  Recommendations are to hold on Eliquis until further GI work-up has been determined.  Discharge Diagnoses:  Principal Problem:   Symptomatic anemia Active Problems:   HTN (hypertension)   Hypothyroidism   Heme positive stool   Atrial fibrillation, chronic    Discharge Instructions  Discharge Instructions    Diet - low sodium heart healthy   Complete by:  As directed    Increase activity slowly   Complete by:  As directed      Allergies as of 06/20/2018      Reactions   Novocain [procaine  Hcl] Rash      Medication List    STOP taking these medications   amoxicillin-clavulanate 875-125 MG tablet Commonly known as:  AUGMENTIN   ELIQUIS 2.5 MG Tabs tablet Generic drug:  apixaban     TAKE these medications   calcium citrate 950 MG tablet Commonly known as:  CALCITRATE - dosed in mg elemental calcium Take 200 mg of elemental calcium by mouth daily.   carboxymethylcellulose 0.5 % Soln Commonly known as:  REFRESH PLUS Place 1 drop into both eyes 3 (three) times daily as needed.   docusate sodium 100 MG capsule Commonly known as:  COLACE Take 100 mg by mouth 2 (two) times daily. Takes 1 capsule in the morning and 1 capsule at night   fish oil-omega-3 fatty acids 1000 MG capsule Take 1 g by mouth daily.   furosemide 40 MG tablet Commonly known as:  LASIX Take 2 tablets (80 mg total) by mouth daily. What changed:    how much to take  when to take this  Another medication with the same name was removed. Continue taking this medication, and follow the directions you see here.   gabapentin 100 MG capsule Commonly known as:  NEURONTIN Take 100 mg by mouth 4 (four) times daily. Takes 2 capsules in the morning and 2 capsules at night   levothyroxine 75 MCG tablet Commonly known as:  SYNTHROID, LEVOTHROID Take 75 mcg by mouth daily.   lovastatin 20 MG tablet Commonly known as:  MEVACOR Take  20 mg by mouth at bedtime.   omeprazole 20 MG capsule Commonly known as:  PRILOSEC Take 20 mg by mouth daily.   PARoxetine 20 MG tablet Commonly known as:  PAXIL Take 30 mg by mouth daily.   Potassium Chloride ER 20 MEQ Tbcr Take 20 mEq by mouth daily.      Follow-up Information    Fields, Marga Melnick, MD Follow up.   Specialty:  Gastroenterology Why:  office will call you with biopsy results and schedule any further testing Contact information: [place] [address] [phone]          Allergies  Allergen Reactions  . Novocain [Procaine  Hcl] Rash    Consultations:  Gastroenterology   Procedures/Studies: Dg Abd Acute W/chest  Result Date: 06/18/2018 CLINICAL DATA:  Abdominal pain with multiple bowel movements today. Dementia. EXAM: DG ABDOMEN ACUTE W/ 1V CHEST COMPARISON:  Chest radiography and chest CT 01/21/2018. FINDINGS: Bowel gas pattern does not show evidence of ileus or obstruction. There is a large hiatal hernia. Moderate amount of fecal matter in the rectum. No abnormal soft tissue calcifications of significance. Old thoracolumbar compression fractures. Bilateral hip replacements. One-view chest shows cardiomegaly and aortic atherosclerosis. Large hiatal hernia as noted above. Chronic interstitial lung markings. No active process. IMPRESSION: Large hiatal hernia. Moderate amount of fecal matter in the rectum. Otherwise unremarkable abdominal films. Electronically Signed   By: Nelson Chimes M.D.   On: 06/18/2018 20:51    EGD:- IRON DEFICIENCY ANEMIA DUE TO LESIONS IN LEFT                            COLON: ISCHEMIA V. NEOPLASM.                           - Large hiatal hernia.                           - MILD Gastritis. Biopsied.  Colonoscopy:- IRON DEFICIENCY ANEMIA DUE TO LESIONS IN LEFT                            COLON: ISCHEMIC V. NEOPLASM  Subjective: Feeling better.  No vomiting.  No abdominal pain.  Discharge Exam: Vitals:   06/20/18 1045 06/20/18 1100 06/20/18 1115 06/20/18 1122  BP: 140/69 (!) 155/72 (!) 157/77 (!) 157/77  Pulse: 72 70 (!) 54 (!) 54  Resp: 20 (!) 23 18 18   Temp:    97.7 F (36.5 C)  TempSrc:    Oral  SpO2: 95% 96% 94% 98%  Weight:      Height:        General: Pt is alert, awake, not in acute distress Cardiovascular: RRR, S1/S2 +, no rubs, no gallops Respiratory: CTA bilaterally, no wheezing, no rhonchi Abdominal: Soft, NT, ND, bowel sounds + Extremities: no edema, no cyanosis    The results of significant diagnostics from this hospitalization (including imaging,  microbiology, ancillary and laboratory) are listed below for reference.     Microbiology: No results found for this or any previous visit (from the past 240 hour(s)).   Labs: BNP (last 3 results) Recent Labs    01/21/18 1334  BNP 222.9*   Basic Metabolic Panel: Recent Labs  Lab 06/18/18 2010  NA 135  K 4.7  CL 102  CO2 24  GLUCOSE  142*  BUN 36*  CREATININE 1.13*  CALCIUM 10.1   Liver Function Tests: Recent Labs  Lab 06/18/18 2010  AST 27  ALT 13  ALKPHOS 52  BILITOT 0.3  PROT 6.8  ALBUMIN 3.4*   Recent Labs  Lab 06/18/18 2010  LIPASE 34   No results for input(s): AMMONIA in the last 168 hours. CBC: Recent Labs  Lab 06/18/18 2010 06/19/18 1711 06/20/18 0656  WBC 11.8*  --  8.5  NEUTROABS 10.4*  --   --   HGB 5.7* 10.0* 9.8*  HCT 20.8* 31.9* 31.7*  MCV 83.2  --  84.1  PLT 401*  --  276   Cardiac Enzymes: No results for input(s): CKTOTAL, CKMB, CKMBINDEX, TROPONINI in the last 168 hours. BNP: Invalid input(s): POCBNP CBG: No results for input(s): GLUCAP in the last 168 hours. D-Dimer No results for input(s): DDIMER in the last 72 hours. Hgb A1c No results for input(s): HGBA1C in the last 72 hours. Lipid Profile No results for input(s): CHOL, HDL, LDLCALC, TRIG, CHOLHDL, LDLDIRECT in the last 72 hours. Thyroid function studies No results for input(s): TSH, T4TOTAL, T3FREE, THYROIDAB in the last 72 hours.  Invalid input(s): FREET3 Anemia work up Recent Labs    06/18/18 2010 06/18/18 2106  VITAMINB12 364  --   FOLATE  --  9.9  FERRITIN 8*  --   TIBC 433  --   IRON 8*  --   RETICCTPCT 1.9  --    Urinalysis    Component Value Date/Time   COLORURINE STRAW (A) 01/21/2018 1430   APPEARANCEUR CLEAR 01/21/2018 1430   LABSPEC 1.009 01/21/2018 1430   PHURINE 7.0 01/21/2018 1430   GLUCOSEU NEGATIVE 01/21/2018 1430   HGBUR NEGATIVE 01/21/2018 1430   BILIRUBINUR NEGATIVE 01/21/2018 1430   KETONESUR NEGATIVE 01/21/2018 1430   PROTEINUR  NEGATIVE 01/21/2018 1430   NITRITE NEGATIVE 01/21/2018 1430   LEUKOCYTESUR NEGATIVE 01/21/2018 1430   Sepsis Labs Invalid input(s): PROCALCITONIN,  WBC,  LACTICIDVEN Microbiology No results found for this or any previous visit (from the past 240 hour(s)).   Time coordinating discharge: 69mins  SIGNED:   Kathie Dike, MD  [facility] 06/20/2018, 6:21 PM Pager   If 7PM-7AM, please contact night-coverage [email or web address] Password [email or web address]

## 2018-06-20 NOTE — Anesthesia Preprocedure Evaluation (Signed)
Anesthesia Evaluation    Airway Mallampati: II       Dental  (+) Implants, Teeth Intact   Pulmonary resolved,     + decreased breath sounds      Cardiovascular hypertension, On Medications + dysrhythmias Atrial Fibrillation  Rhythm:regular     Neuro/Psych    GI/Hepatic   Endo/Other  Hypothyroidism   Renal/GU      Musculoskeletal   Abdominal   Peds  Hematology  (+) Blood dyscrasia, anemia ,   Anesthesia Other Findings Chroic Afib, anticoagulated S/P Tx PRBCs well tolerated Occasionally ambulates with walker and caretaker Physically deconditioned  Reproductive/Obstetrics                             Anesthesia Physical Anesthesia Plan  ASA: IV  Anesthesia Plan: MAC   Post-op Pain Management:    Induction:   PONV Risk Score and Plan:   Airway Management Planned:   Additional Equipment:   Intra-op Plan:   Post-operative Plan:   Informed Consent:   Plan Discussed with: Anesthesiologist  Anesthesia Plan Comments:         Anesthesia Quick Evaluation

## 2018-06-20 NOTE — Anesthesia Postprocedure Evaluation (Signed)
Anesthesia Post Note  Patient: Darylene Price Dymek  Procedure(s) Performed: COLONOSCOPY WITH PROPOFOL (N/A ) ESOPHAGOGASTRODUODENOSCOPY (EGD) WITH PROPOFOL (N/A ) BIOPSY  Patient location during evaluation: PACU Anesthesia Type: MAC Level of consciousness: awake, awake and alert and sedated Pain management: satisfactory to patient Vital Signs Assessment: post-procedure vital signs reviewed and stable Respiratory status: spontaneous breathing and patient connected to nasal cannula oxygen Cardiovascular status: blood pressure returned to baseline and stable Postop Assessment: no backache and no headache Anesthetic complications: no     Last Vitals:  Vitals:   06/20/18 0730 06/20/18 0903  BP: (!) 100/46 (!) 161/59  Pulse: (!) 54   Resp: 18 (!) 21  Temp: 36.7 C 36.7 C  SpO2: 93% 92%    Last Pain:  Vitals:   06/20/18 0903  TempSrc: Oral  PainSc:                  Darnell Level Gavon Majano

## 2018-06-21 ENCOUNTER — Encounter (HOSPITAL_COMMUNITY): Payer: Self-pay | Admitting: Gastroenterology

## 2018-06-22 ENCOUNTER — Encounter (HOSPITAL_COMMUNITY): Payer: Self-pay | Admitting: Gastroenterology

## 2018-06-22 NOTE — Addendum Note (Signed)
Addendum  created 06/22/18 0857 by Vista Deck, CRNA   Intraprocedure Event edited, Intraprocedure Staff edited

## 2018-06-23 ENCOUNTER — Telehealth: Payer: Self-pay | Admitting: Gastroenterology

## 2018-06-23 NOTE — Telephone Encounter (Signed)
[name]. GIVEN ACETAMINOPHEN FOR FEVER T 101F. CURRENTLY CAN'T GET HER TO THE CAR. DAUGHTER HAS BEEN CONSIDERING HOSPICE CARE.  EXPLAINED IF SHE HAS HAD A CATASTROPHIC EVENT TO HER GUT. SHE MAY PASS FROM THAT. DRINK WATER AND RE-START ELIQUIS AS MEDICAL MANAGEMENT. DAUGHTER DECLINES CT ANGIO OR SURGERY AT THIS TIME. DECLINES TO TRANPORT BACK TO ED BECAUSE THAT WOULD NOT BE WHAT HER MOTHER WANTS.   WILL EMAIL DAUGHTER WITH SAME ASSESSMENT: [email or web address].

## 2018-06-23 NOTE — Progress Notes (Signed)
I spoke to pt's daughter, Wells Guiles, and she has questions as to whether she should proceed with this. She has appt with Hospice this afternoon. Pt has fever today and very weak. She was transferred to speak to Dr. Oneida Alar.

## 2018-06-24 ENCOUNTER — Encounter: Payer: Self-pay | Admitting: Gastroenterology

## 2018-06-24 NOTE — Progress Notes (Signed)
CC'D TO PCP °

## 2018-06-24 NOTE — Progress Notes (Signed)
PATIENT SCHEDULED AND LETTER SENT  °

## 2018-06-25 NOTE — Progress Notes (Signed)
Noted  

## 2018-09-30 ENCOUNTER — Ambulatory Visit: Payer: Medicare Other | Admitting: Gastroenterology

## 2019-06-15 IMAGING — CT CT ANGIO CHEST
2 of 6 series · 18 of 46 positions shown · IV contrast (iopamidol)
Comparison: CXR 01/21/2018

CLINICAL DATA: Pain beneath left breast with breathing. Lethargy x5
days.

EXAM:
CT ANGIOGRAPHY CHEST WITH CONTRAST
TECHNIQUE: Multidetector CT imaging of the chest was performed using the
standard protocol during bolus administration of intravenous
contrast. Multiplanar CT image reconstructions and MIPs were
obtained to evaluate the vascular anatomy.
CONTRAST:  80mL 1DXS6Z-VNU IOPAMIDOL (1DXS6Z-VNU) INJECTION 76%

[Series 5: thins · axial · 0.66mm/px · z∈[+1113,+1376]mm · 15 of 289 slices shown]
[im 13/289  lung]
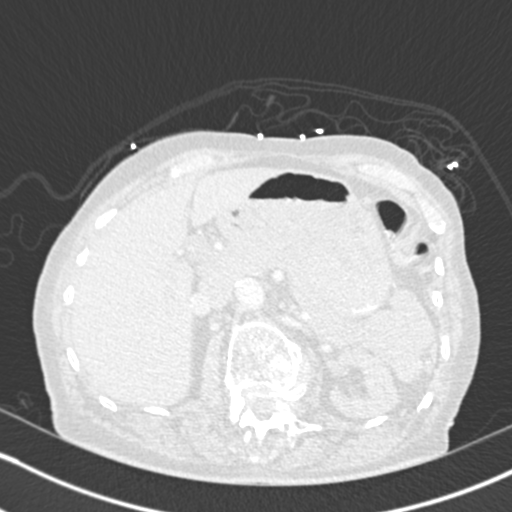
[im 38/289  soft-tissue]
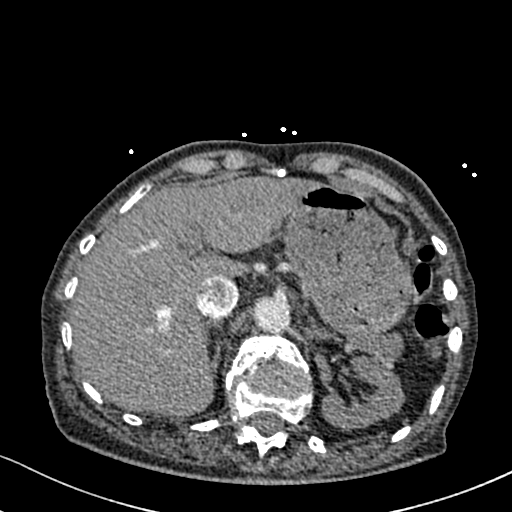
[im 51/289  lung]
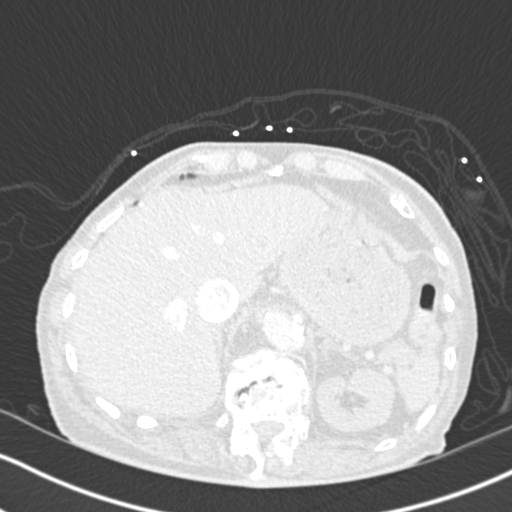
[im 76/289  soft-tissue]
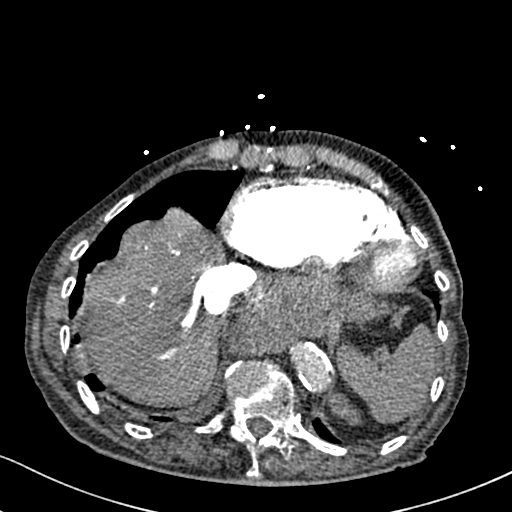
[im 88/289  lung]
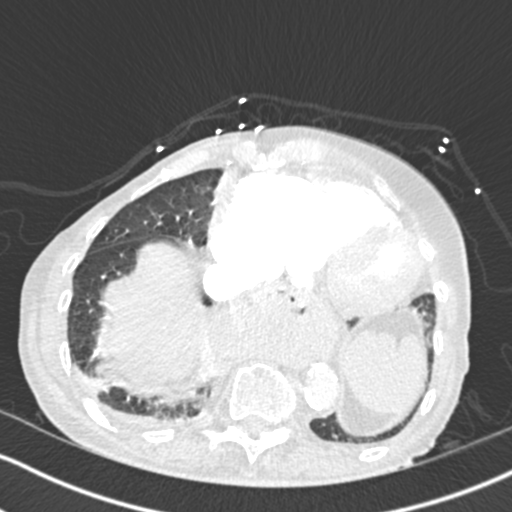
[im 113/289  soft-tissue]
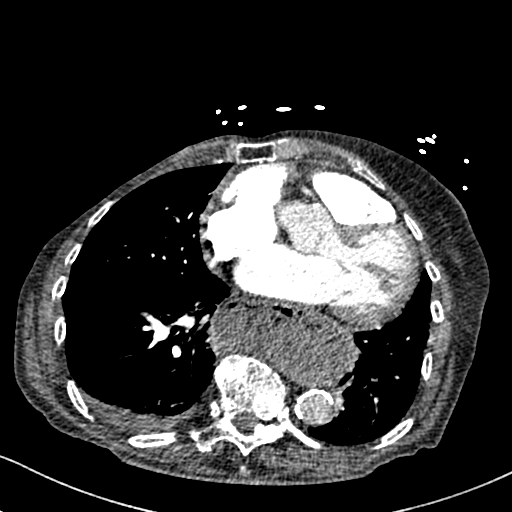
[im 126/289  lung]
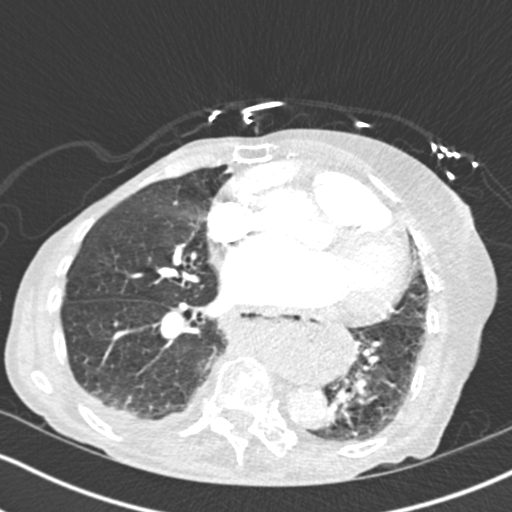
[im 151/289  soft-tissue]
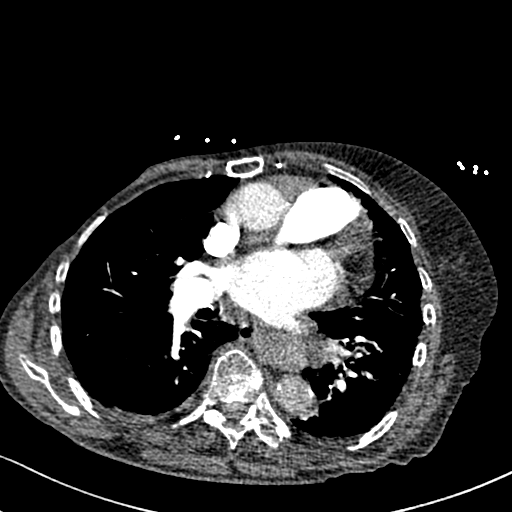
[im 163/289  lung]
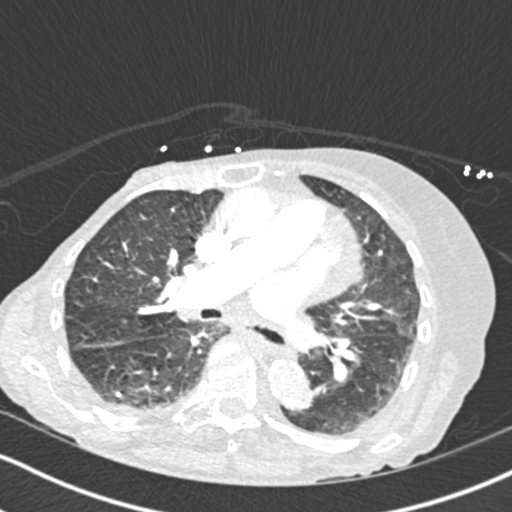
[im 176/289  soft-tissue]
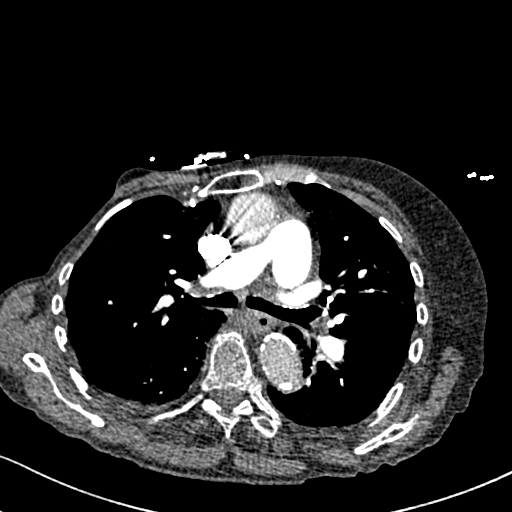
[im 201/289  lung]
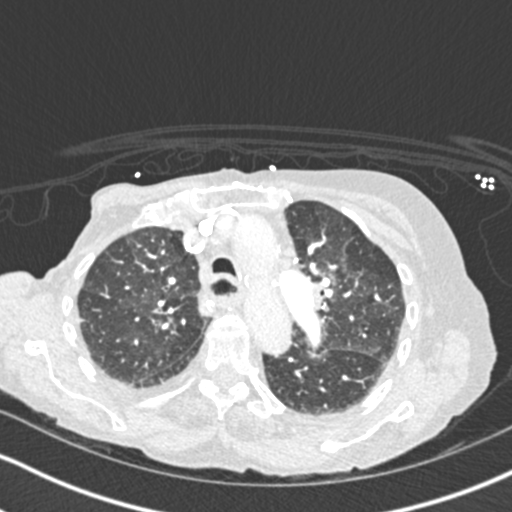
[im 213/289  soft-tissue]
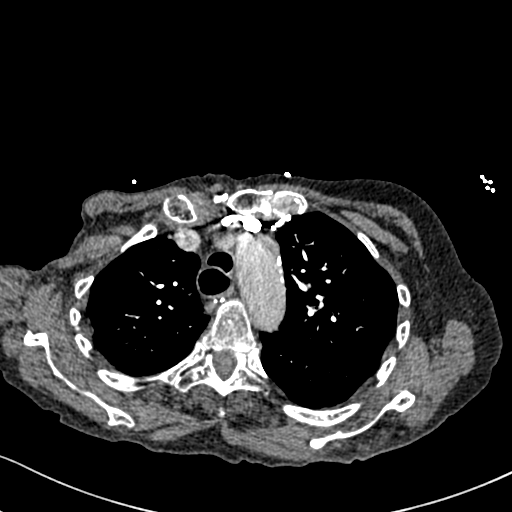
[im 238/289  lung]
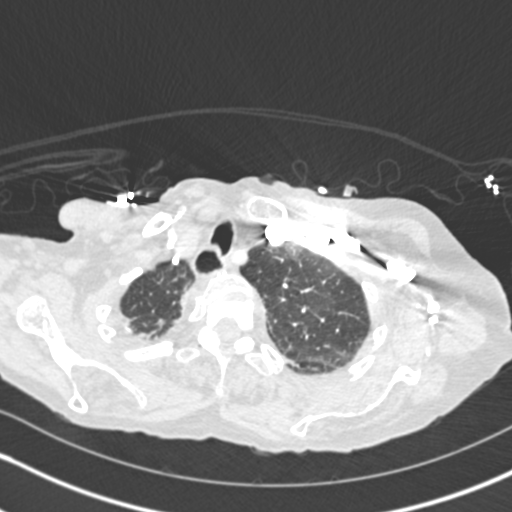
[im 251/289  soft-tissue]
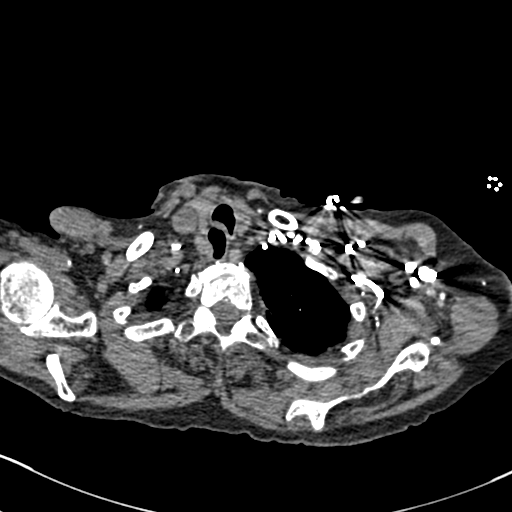
[im 276/289  lung]
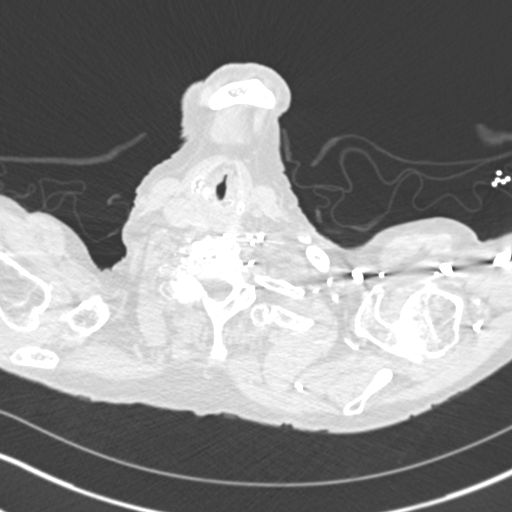

[Series 7: coronal mpr · coronal · 0.53mm/px · 3 of 124 slices shown]
[im 31/124  soft-tissue]
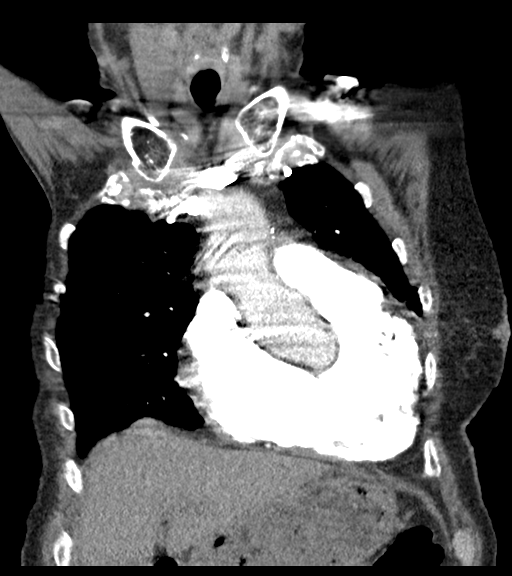
[im 62/124  soft-tissue]
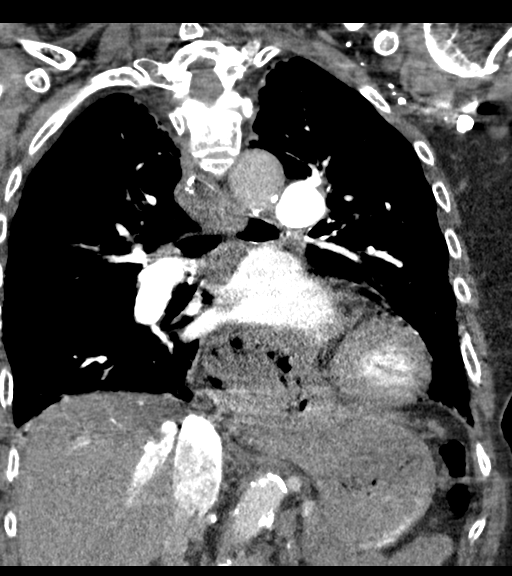
[im 93/124  soft-tissue]
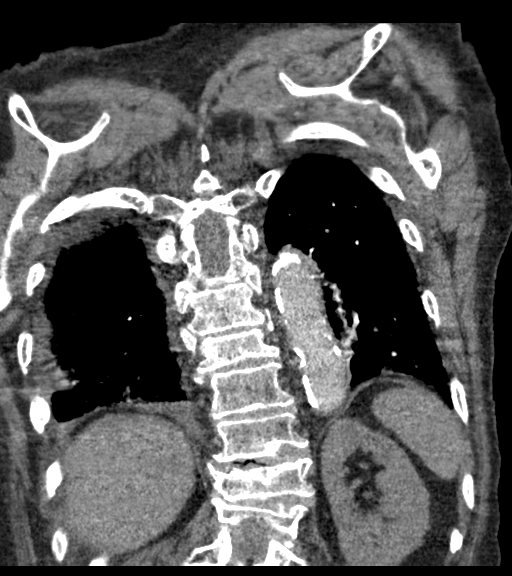

[18 of 46 positions shown; findings below may reference images not displayed]

FINDINGS: Cardiovascular: Preferential opacification of the pulmonary arteries
without acute pulmonary embolus. Cardiomegaly without pericardial
effusion. Moderate aortic atherosclerosis without aneurysm or
dissection. Atherosclerosis at the origins of the great vessels.
Assessment for stenosis is limited due to timing of contrast within
these vessels.

Mediastinum/Nodes: Large hiatal hernia. Small amount of debris in
the esophagus may be secondary to reflux. The trachea and mainstem
bronchi are patent. No adenopathy.

Lungs/Pleura: Patchy ground-glass opacities bilaterally likely
reflecting stigmata of mild CHF. Small right effusion. No dominant
mass or pulmonary consolidation. There is bibasilar atelectasis.

Upper Abdomen: Reflux of contrast into the hepatic veins and IVC
likely representing mild right-sided heart failure. 1.7 cm cyst in
the lower pole of the included left kidney.

Musculoskeletal: Mild T3 superior endplate, moderate biconcave T9,
mild-to-moderate inferior T11 and moderate-to-marked T12 and L1
compression fractures.

Review of the MIP images confirms the above findings.
IMPRESSION: 1. Cardiomegaly with ground-glass opacities within both lungs and
small right effusion compatible changes of mild CHF.
2. Large hiatal hernia with probable reflux into the thoracic
esophagus accounting for debris within the esophagus.
3. Multilevel remote appearing osteoporotic compression fractures of
the thoracolumbar spine.
4. No acute pulmonary embolus.

Aortic Atherosclerosis ([id number].T).

## 2019-07-01 ENCOUNTER — Other Ambulatory Visit (HOSPITAL_COMMUNITY)
Admission: RE | Admit: 2019-07-01 | Discharge: 2019-07-01 | Disposition: A | Discharge status: Home / Self Care | Payer: Medicare Other | Source: Ambulatory Visit | Attending: Family Medicine | Admitting: Family Medicine

## 2019-07-01 DIAGNOSIS — R109 Unspecified abdominal pain: Secondary | ICD-10-CM | POA: Insufficient documentation

## 2019-07-01 DIAGNOSIS — D509 Iron deficiency anemia, unspecified: Secondary | ICD-10-CM | POA: Insufficient documentation

## 2019-07-01 LAB — COMPREHENSIVE METABOLIC PANEL
ALT: 12 U/L (ref 0–44)
AST: 20 U/L (ref 15–41)
Albumin: 2.9 g/dL — ABNORMAL LOW (ref 3.5–5.0)
Alkaline Phosphatase: 45 U/L (ref 38–126)
Anion gap: 9 (ref 5–15)
BUN: 38 mg/dL — ABNORMAL HIGH (ref 8–23)
CO2: 27 mmol/L (ref 22–32)
Calcium: 9.4 mg/dL (ref 8.9–10.3)
Chloride: 98 mmol/L (ref 98–111)
Creatinine, Ser: 0.98 mg/dL (ref 0.44–1.00)
GFR calc Af Amer: 58 mL/min — ABNORMAL LOW (ref >60)
GFR calc non Af Amer: 50 mL/min — ABNORMAL LOW (ref >60)
Glucose, Bld: 107 mg/dL — ABNORMAL HIGH (ref 70–99)
Potassium: 3.7 mmol/L (ref 3.5–5.1)
Sodium: 134 mmol/L — ABNORMAL LOW (ref 135–145)
Total Bilirubin: 0.3 mg/dL (ref 0.3–1.2)
Total Protein: 6.5 g/dL (ref 6.5–8.1)

## 2019-07-01 LAB — CBC WITH DIFFERENTIAL/PLATELET
Abs Immature Granulocytes: 0.02 10*3/uL (ref 0.00–0.07)
Basophils Absolute: 0 10*3/uL (ref 0.0–0.1)
Basophils Relative: 0 %
Eosinophils Absolute: 0 10*3/uL (ref 0.0–0.5)
Eosinophils Relative: 1 %
HCT: 21.7 % — ABNORMAL LOW (ref 36.0–46.0)
Hemoglobin: 6.3 g/dL — CL (ref 12.0–15.0)
Immature Granulocytes: 0 %
Lymphocytes Relative: 14 %
Lymphs Abs: 0.8 10*3/uL (ref 0.7–4.0)
MCH: 26.4 pg (ref 26.0–34.0)
MCHC: 29 g/dL — ABNORMAL LOW (ref 30.0–36.0)
MCV: 90.8 fL (ref 80.0–100.0)
Monocytes Absolute: 0.6 10*3/uL (ref 0.1–1.0)
Monocytes Relative: 11 %
Neutro Abs: 4 10*3/uL (ref 1.7–7.7)
Neutrophils Relative %: 74 %
Platelets: 376 10*3/uL (ref 150–400)
RBC: 2.39 MIL/uL — ABNORMAL LOW (ref 3.87–5.11)
RDW: 19.6 % — ABNORMAL HIGH (ref 11.5–15.5)
WBC: 5.4 10*3/uL (ref 4.0–10.5)
nRBC: 0.7 % — ABNORMAL HIGH (ref 0.0–0.2)

## 2019-07-01 LAB — C-REACTIVE PROTEIN: CRP: 1.1 mg/dL — ABNORMAL HIGH (ref <1.0)

## 2019-07-01 LAB — FERRITIN: Ferritin: 25 ng/mL (ref 11–307)

## 2019-07-01 LAB — LACTIC ACID, PLASMA: Lactic Acid, Venous: 1.9 mmol/L (ref 0.5–1.9)

## 2019-07-01 LAB — SEDIMENTATION RATE: Sed Rate: 75 mm/hr — ABNORMAL HIGH (ref 0–22)

## 2019-07-01 LAB — LIPASE, BLOOD: Lipase: 22 U/L (ref 11–51)

## 2019-07-02 LAB — URINALYSIS, ROUTINE W REFLEX MICROSCOPIC
Bilirubin Urine: NEGATIVE
Glucose, UA: NEGATIVE mg/dL
Hgb urine dipstick: NEGATIVE
Ketones, ur: NEGATIVE mg/dL
Leukocytes,Ua: NEGATIVE
Nitrite: NEGATIVE
Protein, ur: NEGATIVE mg/dL
Specific Gravity, Urine: 1.011 (ref 1.005–1.030)
pH: 5 (ref 5.0–8.0)

## 2019-07-02 LAB — URINE CULTURE: Culture: <10000 — AB

## 2019-07-03 ENCOUNTER — Encounter (HOSPITAL_COMMUNITY): Payer: Self-pay | Admitting: Emergency Medicine

## 2019-07-03 ENCOUNTER — Observation Stay (HOSPITAL_COMMUNITY)
Admission: EM | Admit: 2019-07-03 | Discharge: 2019-07-04 | Disposition: A | Discharge status: Home / Self Care | Payer: Medicare Other | Attending: Family Medicine | Admitting: Family Medicine

## 2019-07-03 ENCOUNTER — Other Ambulatory Visit: Payer: Self-pay

## 2019-07-03 DIAGNOSIS — F0390 Unspecified dementia without behavioral disturbance: Secondary | ICD-10-CM | POA: Diagnosis not present

## 2019-07-03 DIAGNOSIS — E039 Hypothyroidism, unspecified: Secondary | ICD-10-CM | POA: Diagnosis not present

## 2019-07-03 DIAGNOSIS — R531 Weakness: Secondary | ICD-10-CM | POA: Diagnosis present

## 2019-07-03 DIAGNOSIS — E43 Unspecified severe protein-calorie malnutrition: Secondary | ICD-10-CM | POA: Diagnosis present

## 2019-07-03 DIAGNOSIS — D649 Anemia, unspecified: Principal | ICD-10-CM | POA: Insufficient documentation

## 2019-07-03 DIAGNOSIS — Z79899 Other long term (current) drug therapy: Secondary | ICD-10-CM | POA: Insufficient documentation

## 2019-07-03 DIAGNOSIS — Z20828 Contact with and (suspected) exposure to other viral communicable diseases: Secondary | ICD-10-CM | POA: Insufficient documentation

## 2019-07-03 DIAGNOSIS — I482 Chronic atrial fibrillation, unspecified: Secondary | ICD-10-CM | POA: Diagnosis not present

## 2019-07-03 DIAGNOSIS — I10 Essential (primary) hypertension: Secondary | ICD-10-CM | POA: Diagnosis present

## 2019-07-03 DIAGNOSIS — K922 Gastrointestinal hemorrhage, unspecified: Secondary | ICD-10-CM | POA: Insufficient documentation

## 2019-07-03 LAB — COMPREHENSIVE METABOLIC PANEL
ALT: 11 U/L (ref 0–44)
AST: 17 U/L (ref 15–41)
Albumin: 2.7 g/dL — ABNORMAL LOW (ref 3.5–5.0)
Alkaline Phosphatase: 45 U/L (ref 38–126)
Anion gap: 6 (ref 5–15)
BUN: 24 mg/dL — ABNORMAL HIGH (ref 8–23)
CO2: 25 mmol/L (ref 22–32)
Calcium: 9 mg/dL (ref 8.9–10.3)
Chloride: 106 mmol/L (ref 98–111)
Creatinine, Ser: 0.87 mg/dL (ref 0.44–1.00)
GFR calc Af Amer: >60 mL/min (ref >60)
GFR calc non Af Amer: 57 mL/min — ABNORMAL LOW (ref >60)
Glucose, Bld: 97 mg/dL (ref 70–99)
Potassium: 3.7 mmol/L (ref 3.5–5.1)
Sodium: 137 mmol/L (ref 135–145)
Total Bilirubin: 0.5 mg/dL (ref 0.3–1.2)
Total Protein: 6.2 g/dL — ABNORMAL LOW (ref 6.5–8.1)

## 2019-07-03 LAB — CBC WITH DIFFERENTIAL/PLATELET
Abs Immature Granulocytes: 0.01 10*3/uL (ref 0.00–0.07)
Basophils Absolute: 0 10*3/uL (ref 0.0–0.1)
Basophils Relative: 0 %
Eosinophils Absolute: 0 10*3/uL (ref 0.0–0.5)
Eosinophils Relative: 1 %
HCT: 19.9 % — ABNORMAL LOW (ref 36.0–46.0)
Hemoglobin: 5.6 g/dL — CL (ref 12.0–15.0)
Immature Granulocytes: 0 %
Lymphocytes Relative: 12 %
Lymphs Abs: 0.6 10*3/uL — ABNORMAL LOW (ref 0.7–4.0)
MCH: 26.4 pg (ref 26.0–34.0)
MCHC: 28.1 g/dL — ABNORMAL LOW (ref 30.0–36.0)
MCV: 93.9 fL (ref 80.0–100.0)
Monocytes Absolute: 0.7 10*3/uL (ref 0.1–1.0)
Monocytes Relative: 15 %
Neutro Abs: 3.5 10*3/uL (ref 1.7–7.7)
Neutrophils Relative %: 72 %
Platelets: 292 10*3/uL (ref 150–400)
RBC: 2.12 MIL/uL — ABNORMAL LOW (ref 3.87–5.11)
RDW: 21.2 % — ABNORMAL HIGH (ref 11.5–15.5)
WBC: 4.8 10*3/uL (ref 4.0–10.5)
nRBC: 0.4 % — ABNORMAL HIGH (ref 0.0–0.2)

## 2019-07-03 LAB — POC OCCULT BLOOD, ED: Fecal Occult Bld: POSITIVE — AB

## 2019-07-03 LAB — PROTIME-INR
INR: 1.1 (ref 0.8–1.2)
Prothrombin Time: 14.2 seconds (ref 11.4–15.2)

## 2019-07-03 LAB — PREPARE RBC (CROSSMATCH)

## 2019-07-03 MED ORDER — PAROXETINE HCL 10 MG PO TABS
30.0000 mg | ORAL_TABLET | Freq: Every day | ORAL | Status: DC
  Administered 2019-07-04: 30 mg via ORAL
  Filled 2019-07-03: qty 3
  Filled 2019-07-03 (×3): qty 1

## 2019-07-03 MED ORDER — PANTOPRAZOLE SODIUM 40 MG IV SOLR
INTRAVENOUS | Status: AC
  Filled 2019-07-03: qty 160

## 2019-07-03 MED ORDER — HYPROMELLOSE (GONIOSCOPIC) 2.5 % OP SOLN
1.0000 [drp] | Freq: Three times a day (TID) | OPHTHALMIC | Status: DC | PRN
  Filled 2019-07-03: qty 15

## 2019-07-03 MED ORDER — GABAPENTIN 100 MG PO CAPS
200.0000 mg | ORAL_CAPSULE | Freq: Two times a day (BID) | ORAL | Status: DC
  Administered 2019-07-04: 200 mg via ORAL
  Filled 2019-07-03: qty 2

## 2019-07-03 MED ORDER — LEVOTHYROXINE SODIUM 50 MCG PO TABS
75.0000 ug | ORAL_TABLET | Freq: Every morning | ORAL | Status: DC
  Administered 2019-07-04: 75 ug via ORAL
  Filled 2019-07-03: qty 2

## 2019-07-03 MED ORDER — SODIUM CHLORIDE 0.9 % IV SOLN
80.0000 mg | Freq: Once | INTRAVENOUS | Status: AC
  Administered 2019-07-03: 80 mg via INTRAVENOUS
  Filled 2019-07-03: qty 80

## 2019-07-03 MED ORDER — ACETAMINOPHEN 650 MG RE SUPP: 650.0000 mg | Freq: Four times a day (QID) | RECTAL | Status: DC | PRN

## 2019-07-03 MED ORDER — SENNOSIDES-DOCUSATE SODIUM 8.6-50 MG PO TABS
2.0000 | ORAL_TABLET | Freq: Two times a day (BID) | ORAL | Status: DC
  Administered 2019-07-04: 2 via ORAL
  Filled 2019-07-03: qty 2

## 2019-07-03 MED ORDER — ACETAMINOPHEN 325 MG PO TABS: 650.0000 mg | ORAL_TABLET | Freq: Four times a day (QID) | ORAL | Status: DC | PRN

## 2019-07-03 MED ORDER — SODIUM CHLORIDE 0.9 % IV SOLN
8.0000 mg/h | INTRAVENOUS | Status: DC
  Administered 2019-07-03: 8 mg/h via INTRAVENOUS
  Filled 2019-07-03 (×5): qty 80

## 2019-07-03 MED ORDER — SODIUM CHLORIDE 0.9 % IV SOLN
10.0000 mL/h | Freq: Once | INTRAVENOUS | Status: AC
  Administered 2019-07-03: 10 mL/h via INTRAVENOUS

## 2019-07-03 NOTE — ED Triage Notes (Signed)
EMS called out for weakness per family. Pt seen on Wednesday by PCP and was told pt needed a blood transfusion. Blood glucose was 86 per EMS. Pt with advanced dementia.

## 2019-07-03 NOTE — ED Provider Notes (Signed)
[facility] EMERGENCY DEPARTMENT Provider Note   CSN: [id number] Arrival date & time: 07/03/19  1911     History Chief Complaint  Patient presents with  . Weakness    Raven Moore is a 85 y.o. female with a history of dementia, hypercholesterolemia, hypertension, chronic atrial fibrillation on Eliquis, and anemia who presents to the emergency department via EMS for generalized weakness.  I called and spoke with patient's daughter via telephone and subsequently when arrived in the emergency department, she states that patient had a routine outpatient visit with labs and was found to be anemic with a hemoglobin of 6.3.  She received a call from the patient's PCP with instructions that the patient needed a blood transfusion and to stop her Eliquis.  She states that she stopped the patient's Eliquis same day which was a couple of days ago, however she did not want to bring her mother to the emergency department due to concern regarding our current visitor policy and her mother's history of dementia, her mother also seemed to be doing well at that time and she was trying to way to have an outpatient infusion done @ her home which was unsuccessful.  However, today the patient seemed to more generally weak which prompted her to bring her in.  She states that she did have an episode of nonbloody emesis on Wednesday (06/29/19) evening which she states was thought to be due to to some stomach pain prior to having a bowel movement.  She has not had any other emesis or pain since then.  Her bowel movements have been fairly normal.  They are typically dark due to her being on an iron supplement and she has not noticed any color change or bright red blood.  Patient denies any current complaints.  She denies pain.  Level 5 caveat applies secondary to dementia.  HPI     Past Medical History:  Diagnosis Date  . Atrial fibrillation ([place])   . Breast cancer ([place]) 1982   On the right.  . DJD (degenerative  joint disease)   . High cholesterol   . Hypertension   . Hypothyroidism 06/06/2012    Patient Active Problem List   Diagnosis Date Noted  . Atrial fibrillation, chronic ([place]) 06/19/2018  . Symptomatic anemia 06/18/2018  . Heme positive stool 06/18/2018  . CAP (community acquired pneumonia) 06/06/2012  . Anemia 06/06/2012  . Dehydration 06/06/2012  . HTN (hypertension) 06/06/2012  . Hypothyroidism 06/06/2012    Past Surgical History:  Procedure Laterality Date  . ABDOMINAL HYSTERECTOMY     For fibroid tumors.  . Bilateral total hip replacements    . BIOPSY  06/20/2018   Procedure: BIOPSY;  Surgeon: Danie Binder, MD;  [facility];  Service: Endoscopy;;  . BREAST SURGERY    . COLONOSCOPY WITH PROPOFOL N/A 06/20/2018   Procedure: COLONOSCOPY WITH PROPOFOL;  Surgeon: Danie Binder, MD;  Location[facility];  Service: Endoscopy;  Laterality: N/A;  . ESOPHAGOGASTRODUODENOSCOPY (EGD) WITH PROPOFOL N/A 06/20/2018   Procedure: ESOPHAGOGASTRODUODENOSCOPY (EGD) WITH PROPOFOL;  Surgeon: Danie Binder, MD;  Location[facility];  Service: Endoscopy;  Laterality: N/A;  . Right-sided mastectomy  1982     OB History    Gravida      Para      Term      Preterm      AB      Living  1     SAB  TAB      Ectopic      Multiple      Live Births              Family History  Problem Relation Age of Onset  . Colon cancer Neg Hx   . Colon polyps Neg Hx     Social History   Tobacco Use  . Smoking status: Never Smoker  . Smokeless tobacco: Never Used  Substance Use Topics  . Alcohol use: No  . Drug use: No    Home Medications Prior to Admission medications   Medication Sig Start Date End Date Taking? Authorizing Provider  calcium citrate (CALCITRATE - DOSED IN MG ELEMENTAL CALCIUM) 950 MG tablet Take 200 mg of elemental calcium by mouth daily.    [provider]  carboxymethylcellulose (REFRESH PLUS) 0.5 % SOLN Place 1 drop into  both eyes 3 (three) times daily as needed.    [provider]  docusate sodium (COLACE) 100 MG capsule Take 100 mg by mouth 2 (two) times daily. Takes 1 capsule in the morning and 1 capsule at night    [provider]  fish oil-omega-3 fatty acids 1000 MG capsule Take 1 g by mouth daily.    [provider]  furosemide (LASIX) 40 MG tablet Take 2 tablets (80 mg total) by mouth daily. 06/20/18   Kathie Dike, MD  gabapentin (NEURONTIN) 100 MG capsule Take 100 mg by mouth 4 (four) times daily. Takes 2 capsules in the morning and 2 capsules at night    [provider]  levothyroxine (SYNTHROID, LEVOTHROID) 75 MCG tablet Take 75 mcg by mouth daily.    [provider]  lovastatin (MEVACOR) 20 MG tablet Take 20 mg by mouth at bedtime.    [provider]  omeprazole (PRILOSEC) 20 MG capsule Take 20 mg by mouth daily.    [provider]  PARoxetine (PAXIL) 20 MG tablet Take 30 mg by mouth daily.     [provider]  Potassium Chloride ER 20 MEQ TBCR Take 20 mEq by mouth daily. 01/21/18   Daleen Bo, MD    Allergies    Novocain [procaine hcl]  Review of Systems   Review of Systems  Unable to perform ROS: Dementia    Physical Exam Updated Vital Signs BP 121/77 (BP Location: Left Arm)   Pulse 73   Temp 99.2 F (37.3 C) (Oral)   Resp 20   Ht 5\' 5"  (1.651 m)   Wt 56.7 kg   LMP 06/06/2012   SpO2 91%   BMI 20.80 kg/m   Physical Exam Vitals and nursing note reviewed. Exam conducted with a chaperone present.  Constitutional:      General: She is not in acute distress.    Appearance: She is well-developed. She is not toxic-appearing.  HENT:     Head: Normocephalic and atraumatic.  Eyes:     General:        Right eye: No discharge.        Left eye: No discharge.     Comments: Conjunctival pallor noted.  Cardiovascular:     Rate and Rhythm: Normal rate and regular rhythm.  Pulmonary:     Effort: Pulmonary effort  is normal. No respiratory distress.     Breath sounds: Normal breath sounds. No wheezing, rhonchi or rales.  Abdominal:     General: There is no distension.     Palpations: Abdomen is soft.     Tenderness: There is  no abdominal tenderness. There is no guarding or rebound.  Genitourinary:    Comments: DRE with dark stool concerning for melena. No bright red blood.  Musculoskeletal:     Cervical back: Neck supple.  Skin:    General: Skin is warm and dry.     Findings: No rash.  Neurological:     Mental Status: She is alert.     Comments: Clear speech.   Psychiatric:        Behavior: Behavior normal.     ED Results / Procedures / Treatments   Labs (all labs ordered are listed, but only abnormal results are displayed) Labs Reviewed  POC OCCULT BLOOD, ED - Abnormal; Notable for the following components:      Result Value   Fecal Occult Bld POSITIVE (*)    All other components within normal limits  COMPREHENSIVE METABOLIC PANEL  CBC WITH DIFFERENTIAL/PLATELET  TYPE AND SCREEN    EKG EKG Interpretation  Date/Time:  Sunday July 03 2019 19:54:42 EST Ventricular Rate:  72 PR Interval:    QRS Duration: 83 QT Interval:  322 QTC Calculation: 353 R Axis:   93 Text Interpretation: Atrial fibrillation Paired ventricular premature complexes Anterior infarct, old Borderline repolarization abnormality Confirmed by Nat Christen [phone]) on 07/03/2019 7:57:53 PM   Radiology No results found.  Procedures .Critical Care Performed by: Amaryllis Dyke, PA-C Authorized by: Amaryllis Dyke, PA-C    CRITICAL CARE Performed by: Kennith Maes   Total critical care time: 30 minutes  Critical care time was exclusive of separately billable procedures and treating other patients.  Critical care was necessary to treat or prevent imminent or life-threatening deterioration.  Critical care was time spent personally by me on the following activities: development of  treatment plan with patient and/or surrogate as well as nursing, discussions with consultants, evaluation of patient's response to treatment, examination of patient, obtaining history from patient or surrogate, ordering and performing treatments and interventions, ordering and review of laboratory studies, ordering and review of radiographic studies, pulse oximetry and re-evaluation of patient's condition.    (including critical care time)  Medications Ordered in ED Medications  pantoprazole (PROTONIX) 80 mg in sodium chloride 0.9 % 100 mL IVPB (80 mg Intravenous New Bag/Given 07/03/19 2149)  pantoprazole (PROTONIX) 80 mg in sodium chloride 0.9 % 250 mL (0.32 mg/mL) infusion (8 mg/hr Intravenous New Bag/Given 07/03/19 2149)  0.9 %  sodium chloride infusion (10 mL/hr Intravenous New Bag/Given 07/03/19 2147)    ED Course  I have reviewed the triage vital signs and the nursing notes.  Pertinent labs & imaging results that were available during my care of the patient were reviewed by me and considered in my medical decision making (see chart for details).    MDM Rules/Calculators/A&P                      Patient presents to the emergency department with generalized weakness and known anemia on outpatient labs.  Vitals without significant abnormality.  DRE with findings concerning for melena, fecal occult positive.  Abdomen nontender without peritoneal signs.  CBC with critical anemia with hemoglobin of 5.6 and hematocrit of 19.9.  No leukocytosis or platelet dysfunction.  CMP is fairly unremarkable.  Prior results reviewed:  Upper endoscopy/colonoscopy 06/20/18:  IMPRESSION: IRON DEFICIENCY ANEMIA DUE TO LESIONS IN LEFT COLON: ISCHEMIA V. NEOPLASM. - Large hiatal hernia. - MILD Gastritis. Biopsied.  Discussed with patient's daughter at bedside @ length, specifically discussed option of  GI consultation, she does not wish to have this done at this time as she does not think her mother would  want any further intervention in terms of evaluation of bleeding.  She is in agreement with Protonix bolus and drip with administration of blood and admission to the hospital.   Findings and plan of care discussed with supervising physician Dr. Lacinda Axon who is in agreement.   21:56: CONSULT: Discussed case with hospitalist Dr. Maudie Mercury- accepts admission.   Alaeyah Haghighi Buckwalter was evaluated in Emergency Department on 07/03/2019 for the symptoms described in the history of present illness. He/she was evaluated in the context of the global COVID-19 pandemic, which necessitated consideration that the patient might be at risk for infection with the SARS-CoV-2 virus that causes COVID-19. Institutional protocols and algorithms that pertain to the evaluation of patients at risk for COVID-19 are in a state of rapid change based on information released by regulatory bodies including the CDC and federal and state organizations. These policies and algorithms were followed during the patient's care in the ED.   Final Clinical Impression(s) / ED Diagnoses Final diagnoses:  Gastrointestinal hemorrhage, unspecified gastrointestinal hemorrhage type  Anemia, unspecified type    Rx / DC Orders ED Discharge Orders    None       Leafy Kindle 07/03/19 2156    Nat Christen, MD 07/06/19 [phone]

## 2019-07-03 NOTE — H&P (Addendum)
[facility] H&P    Patient Demographics:    Raven Moore, is a 85 y.o. female  MRN: [id number]  DOB - December 20, 1923  Admit Date - 07/03/2019  Referring MD/NP/PA:  Kennith Maes  Outpatient Primary MD for the patient is Ahmed Prima, Laurita Quint, NP [facility]  Patient coming from:  home  Chief complaint- anemia   HPI:    Raven Moore  is a 85 y.o. female,  w hypothyroidism, h/o right sided breast cancer,  hypertension, Pafib, Iron deficiency anemia, w prior admission 06/18/2018 for anemia, w heme positive stool, was told by PCP to go to ER for low Hgb <6.  Pt stopped Eliquis about 1 week ago. Pt denies n/v, hematemesis, abd pain, diarrhea, brbpr.  + dizziness with walking.   EGD 06/20/2018-> mild gastritis, Colonoscopy 06/20/2018 -> ulcerated non-obstructing medium -sized mass at the splenic flexure.  (mucosal necrosis)   Daughter her POA is not in favor of endoscopy at this time as long as Hgb stable.   In ED,  T 99.2, P 73  R 20, Bp 121/77 pox 91% on RA Wt 56.7kg  Na 137, K 3.7, Bun 24, Creatinine 0.87 Alb 2.7 Ast 17, Alt 11 Wbc 4.8, Hgb 5.6, Plt 292 FOBT positive INR 1.1  2 units prbc transfusing currently.  protonix 80mg  iv x1, then 8mg / hr started by ED.   Pt will be admitted for symptomatic anemia, w heme positive stool.        Review of systems:    In addition to the HPI above,  No Fever-chills, No Headache, No changes with Vision or hearing, No problems swallowing food or Liquids, No Chest pain, Cough or Shortness of Breath, No Abdominal pain, No Nausea or Vomiting, bowel movements are regular, No Blood in  Urine, No dysuria, No new skin rashes or bruises, No new joints pains-aches,  No new weakness, tingling, numbness in any extremity, No recent weight gain or loss, No polyuria, polydypsia or polyphagia, No significant Mental Stressors.  All other systems reviewed and are  negative.    Past History of the following :    Past Medical History:  Diagnosis Date  . Atrial fibrillation ([place])   . Breast cancer ([place]) 1982   On the right.  . DJD (degenerative joint disease)   . High cholesterol   . Hypertension   . Hypothyroidism 06/06/2012      Past Surgical History:  Procedure Laterality Date  . ABDOMINAL HYSTERECTOMY     For fibroid tumors.  . Bilateral total hip replacements    . BIOPSY  06/20/2018   Procedure: BIOPSY;  Surgeon: Danie Binder, MD;  [facility];  Service: Endoscopy;;  . BREAST SURGERY    . COLONOSCOPY WITH PROPOFOL N/A 06/20/2018   Procedure: COLONOSCOPY WITH PROPOFOL;  Surgeon: Danie Binder, MD;  [facility];  Service: Endoscopy;  Laterality: N/A;  . ESOPHAGOGASTRODUODENOSCOPY (EGD) WITH PROPOFOL N/A 06/20/2018   Procedure: ESOPHAGOGASTRODUODENOSCOPY (EGD) WITH PROPOFOL;  Surgeon: Danie Binder, MD;  [facility];  Service: Endoscopy;  Laterality: N/A;  . Right-sided mastectomy  1982      Social History:      Social History   Tobacco Use  . Smoking status: Never Smoker  . Smokeless tobacco: Never Used  Substance Use Topics  . Alcohol use: No       Family History :     Family History  Problem Relation Age of Onset  . Colon cancer Neg Hx   . Colon polyps Neg Hx        Home Medications:   Prior to Admission medications   Medication Sig Start Date End Date Taking? Authorizing Provider  carboxymethylcellulose (REFRESH PLUS) 0.5 % SOLN Place 1 drop into both eyes 3 (three) times daily as needed (for dry eye relief).    Yes [provider]  ferrous sulfate 325 (65 FE) MG EC tablet Take 325 mg by mouth daily.   Yes [provider]  fish oil-omega-3 fatty acids 1000 MG capsule Take 1 g by mouth daily.   Yes [provider]  gabapentin (NEURONTIN) 100 MG capsule Take 200 mg by mouth 2 (two) times daily.    Yes [provider]  levothyroxine  (SYNTHROID, LEVOTHROID) 75 MCG tablet Take 75 mcg by mouth every morning.    Yes [provider]  omeprazole (PRILOSEC) 20 MG capsule Take 20 mg by mouth every evening.    Yes [provider]  PARoxetine (PAXIL) 30 MG tablet Take 30 mg by mouth daily.    Yes [provider]  senna-docusate (SENOKOT-S) 8.6-50 MG tablet Take 2 tablets by mouth 2 (two) times daily.   Yes [provider]  furosemide (LASIX) 40 MG tablet Take 2 tablets (80 mg total) by mouth daily. Patient not taking: Reported on 07/03/2019 06/20/18   Kathie Dike, MD  Potassium Chloride ER 20 MEQ TBCR Take 20 mEq by mouth daily. Patient not taking: Reported on 07/03/2019 01/21/18   Daleen Bo, MD     Allergies:     Allergies  Allergen Reactions  . Novocain [Procaine Hcl] Rash     Physical Exam:   Vitals  Blood pressure 118/66, pulse 63, temperature 99.2 F (37.3 C), temperature source Oral, resp. rate 18, height 5\' 5"  (1.651 m), weight 56.7 kg, last menstrual period 06/06/2012, SpO2 100 %.  1.  General: Axoxo3, hard of hearing  2. Psychiatric: euthymic  3. Neurologic: cn2-12 intact, reflexes 2+ symmetric, diffuse with no clonus, motor 5/5 in all 4 ext  4. HEENMT:  Anicteric, pale conjunctiva,  Pupils 1.68mm symmetric, direct, consensual, intact Neck: no jvd  5. Respiratory : CTAB  6. Cardiovascular : Irr, irr,  s1, s2,   7. Gastrointestinal:  Abd: soft, nt, nd, +bs  8. Skin:  Ext: no c/c/e, no rash  9.Musculoskeletal:  Good ROM    Data Review:    CBC Recent Labs  Lab 07/01/19 1300 07/03/19 2040  WBC 5.4 4.8  HGB 6.3* 5.6*  HCT 21.7* 19.9*  PLT 376 292  MCV 90.8 93.9  MCH 26.4 26.4  MCHC 29.0* 28.1*  RDW 19.6* 21.2*  LYMPHSABS 0.8 0.6*  MONOABS 0.6 0.7  EOSABS 0.0 0.0  BASOSABS 0.0 0.0   ------------------------------------------------------------------------------------------------------------------  Results for orders placed or performed  during the hospital encounter of 07/03/19 (from the past 48 hour(s))  POC occult blood, ED Provider will collect     Status: Abnormal   Collection Time: 07/03/19  7:26 PM  Result Value Ref Range   Fecal Occult  Bld POSITIVE (A) NEGATIVE  Comprehensive metabolic panel     Status: Abnormal   Collection Time: 07/03/19  8:40 PM  Result Value Ref Range   Sodium 137 135 - 145 mmol/L   Potassium 3.7 3.5 - 5.1 mmol/L   Chloride 106 98 - 111 mmol/L   CO2 25 22 - 32 mmol/L   Glucose, Bld 97 70 - 99 mg/dL   BUN 24 (H) 8 - 23 mg/dL   Creatinine, Ser 0.87 0.44 - 1.00 mg/dL   Calcium 9.0 8.9 - 10.3 mg/dL   Total Protein 6.2 (L) 6.5 - 8.1 g/dL   Albumin 2.7 (L) 3.5 - 5.0 g/dL   AST 17 15 - 41 U/L   ALT 11 0 - 44 U/L   Alkaline Phosphatase 45 38 - 126 U/L   Total Bilirubin 0.5 0.3 - 1.2 mg/dL   GFR calc non Af Amer 57 (L) >60 mL/min   GFR calc Af Amer >60 >60 mL/min   Anion gap 6 5 - 15    Comment: Performed at [facility], [address] [place], [place] [address]  CBC with Differential     Status: Abnormal   Collection Time: 07/03/19  8:40 PM  Result Value Ref Range   WBC 4.8 4.0 - 10.5 K/uL   RBC 2.12 (L) 3.87 - 5.11 MIL/uL   Hemoglobin 5.6 (LL) 12.0 - 15.0 g/dL    Comment: This critical result has verified and been called to ELLIS,K by Duncan Dull on 12 20 2020 at 2108, and has been read back.    HCT 19.9 (L) 36.0 - 46.0 %   MCV 93.9 80.0 - 100.0 fL   MCH 26.4 26.0 - 34.0 pg   MCHC 28.1 (L) 30.0 - 36.0 g/dL   RDW 21.2 (H) 11.5 - 15.5 %   Platelets 292 150 - 400 K/uL   nRBC 0.4 (H) 0.0 - 0.2 %   Neutrophils Relative % 72 %   Neutro Abs 3.5 1.7 - 7.7 K/uL   Lymphocytes Relative 12 %   Lymphs Abs 0.6 (L) 0.7 - 4.0 K/uL   Monocytes Relative 15 %   Monocytes Absolute 0.7 0.1 - 1.0 K/uL   Eosinophils Relative 1 %   Eosinophils Absolute 0.0 0.0 - 0.5 K/uL   Basophils Relative 0 %   Basophils Absolute 0.0 0.0 - 0.1 K/uL   Immature Granulocytes 0 %   Abs Immature Granulocytes 0.01 0.00  - 0.07 K/uL    Comment: Performed at [facility], [address] [place], [place] [address]  Type and screen [facility]     Status: None (Preliminary result)   Collection Time: 07/03/19  8:40 PM  Result Value Ref Range   ABO/RH(D) O POS    Antibody Screen NEG    Sample Expiration 07/06/2019,2359    Unit Number [id number]    Blood Component Type RBC LR PHER1    Unit division 00    Status of Unit ALLOCATED    Transfusion Status OK TO TRANSFUSE    Crossmatch Result Compatible    Unit Number [id number]    Blood Component Type RED CELLS,LR    Unit division 00    Status of Unit ISSUED    Transfusion Status OK TO TRANSFUSE    Crossmatch Result      Compatible Performed at [facility], [address], [place], [place] [address]   Protime-INR     Status: None   Collection Time: 07/03/19  8:40 PM  Result  Value Ref Range   Prothrombin Time 14.2 11.4 - 15.2 seconds   INR 1.1 0.8 - 1.2    Comment: (NOTE) INR goal varies based on device and disease states. Performed at [facility], [address], [place], [place] [address]   Prepare RBC     Status: None   Collection Time: 07/03/19  8:40 PM  Result Value Ref Range   Order Confirmation      ORDER PROCESSED BY BLOOD BANK Performed at [facility], [address] [place], [place] [address]     Chemistries  Recent Labs  Lab 07/01/19 1300 07/03/19 2040  NA 134* 137  K 3.7 3.7  CL 98 106  CO2 27 25  GLUCOSE 107* 97  BUN 38* 24*  CREATININE 0.98 0.87  CALCIUM 9.4 9.0  AST 20 17  ALT 12 11  ALKPHOS 45 45  BILITOT 0.3 0.5   ------------------------------------------------------------------------------------------------------------------  ------------------------------------------------------------------------------------------------------------------ GFR: Estimated Creatinine Clearance: 36.2 mL/min (by C-G formula based on SCr of 0.87 mg/dL). Liver Function Tests: Recent Labs  Lab 07/01/19 1300  07/03/19 2040  AST 20 17  ALT 12 11  ALKPHOS 45 45  BILITOT 0.3 0.5  PROT 6.5 6.2*  ALBUMIN 2.9* 2.7*   Recent Labs  Lab 07/01/19 1300  LIPASE 22   No results for input(s): AMMONIA in the last 168 hours. Coagulation Profile: Recent Labs  Lab 07/03/19 2040  INR 1.1   Cardiac Enzymes: No results for input(s): CKTOTAL, CKMB, CKMBINDEX, TROPONINI in the last 168 hours. BNP (last 3 results) No results for input(s): PROBNP in the last 8760 hours. HbA1C: No results for input(s): HGBA1C in the last 72 hours. CBG: No results for input(s): GLUCAP in the last 168 hours. Lipid Profile: No results for input(s): CHOL, HDL, LDLCALC, TRIG, CHOLHDL, LDLDIRECT in the last 72 hours. Thyroid Function Tests: No results for input(s): TSH, T4TOTAL, FREET4, T3FREE, THYROIDAB in the last 72 hours. Anemia Panel: Recent Labs    07/01/19 1300  FERRITIN 25    --------------------------------------------------------------------------------------------------------------- Urine analysis:    Component Value Date/Time   COLORURINE YELLOW 07/01/2019 1230   APPEARANCEUR CLEAR 07/01/2019 1230   LABSPEC 1.011 07/01/2019 1230   PHURINE 5.0 07/01/2019 1230   GLUCOSEU NEGATIVE 07/01/2019 1230   HGBUR NEGATIVE 07/01/2019 [place] 07/01/2019 1230   KETONESUR NEGATIVE 07/01/2019 1230   PROTEINUR NEGATIVE 07/01/2019 1230   NITRITE NEGATIVE 07/01/2019 1230   LEUKOCYTESUR NEGATIVE 07/01/2019 1230      Imaging Results:    No results found.     Assessment & Plan:    Active Problems:   Anemia   HTN (hypertension)   Hypothyroidism   Atrial fibrillation, chronic (H[facility])   Protein-calorie malnutrition, severe ([facility])  Anemia Check ferritin, iron, tibc, b12, folate, consider spep, upep NPO protonix 80mg  iv x1 then 8mg / hr Transfuse 2 units prbc,  Check cbc in am GI consult placed in computer for AM, family seems uninterested in repeat endoscopy  Chronic Afib STOP Eliquis (pt  stopped 1 week ago)  Hypothyroidism Cont Levothyroxine Check Tsh  Anxiety Cont Paxil 30mg  po qday  H/o Constipation Cont Senokot 2 po bid  H/o Iron deficiency anemia Check iron studies as above Hold iron temporarily, please resume on discharge  Severe protein calorie malnutrition Please start prostat 30 mL po bid on discharge    DVT Prophylaxis-   SCDs    AM Labs Ordered, also please review Full Orders  Family Communication: Admission, patients condition and plan of  care including tests being ordered have been discussed with the patient  who indicate understanding and agree with the plan and Code Status.  Code Status:  DNR per patient, daughter present at bedside in ED, and confirms DNR  Admission status: Observation: Based on patients clinical presentation and evaluation of above clinical data, I have made determination that patient meets observation criteria at this time.   Time spent in minutes : 60 minutes   Jani Gravel M.D on 07/03/2019 at 10:08 PM

## 2019-07-03 NOTE — ED Notes (Signed)
Date and time results received: 07/03/19 9:08 PM  (use smartphrase ".now" to insert current time)  Test: hgb Critical Value: 5.6  Name of Provider Notified: Aldona Bar PA  Orders Received? Or Actions Taken?: na

## 2019-07-04 ENCOUNTER — Encounter (HOSPITAL_COMMUNITY): Payer: Self-pay | Admitting: Internal Medicine

## 2019-07-04 ENCOUNTER — Other Ambulatory Visit: Payer: Self-pay

## 2019-07-04 DIAGNOSIS — I482 Chronic atrial fibrillation, unspecified: Secondary | ICD-10-CM | POA: Diagnosis not present

## 2019-07-04 DIAGNOSIS — E039 Hypothyroidism, unspecified: Secondary | ICD-10-CM | POA: Diagnosis not present

## 2019-07-04 DIAGNOSIS — D649 Anemia, unspecified: Secondary | ICD-10-CM | POA: Diagnosis not present

## 2019-07-04 LAB — COMPREHENSIVE METABOLIC PANEL
ALT: 11 U/L (ref 0–44)
AST: 18 U/L (ref 15–41)
Albumin: 2.8 g/dL — ABNORMAL LOW (ref 3.5–5.0)
Alkaline Phosphatase: 44 U/L (ref 38–126)
Anion gap: 11 (ref 5–15)
BUN: 20 mg/dL (ref 8–23)
CO2: 24 mmol/L (ref 22–32)
Calcium: 9.5 mg/dL (ref 8.9–10.3)
Chloride: 103 mmol/L (ref 98–111)
Creatinine, Ser: 0.82 mg/dL (ref 0.44–1.00)
GFR calc Af Amer: >60 mL/min (ref >60)
GFR calc non Af Amer: >60 mL/min (ref >60)
Glucose, Bld: 85 mg/dL (ref 70–99)
Potassium: 3.4 mmol/L — ABNORMAL LOW (ref 3.5–5.1)
Sodium: 138 mmol/L (ref 135–145)
Total Bilirubin: 0.7 mg/dL (ref 0.3–1.2)
Total Protein: 6.3 g/dL — ABNORMAL LOW (ref 6.5–8.1)

## 2019-07-04 LAB — VITAMIN B12: Vitamin B-12: 237 pg/mL (ref 180–914)

## 2019-07-04 LAB — CBC
HCT: 30.2 % — ABNORMAL LOW (ref 36.0–46.0)
HCT: 29 % — ABNORMAL LOW (ref 36.0–46.0)
Hemoglobin: 9 g/dL — ABNORMAL LOW (ref 12.0–15.0)
Hemoglobin: 8.5 g/dL — ABNORMAL LOW (ref 12.0–15.0)
MCH: 27.7 pg (ref 26.0–34.0)
MCH: 27.4 pg (ref 26.0–34.0)
MCHC: 29.8 g/dL — ABNORMAL LOW (ref 30.0–36.0)
MCHC: 29.3 g/dL — ABNORMAL LOW (ref 30.0–36.0)
MCV: 92.9 fL (ref 80.0–100.0)
MCV: 93.5 fL (ref 80.0–100.0)
Platelets: 276 10*3/uL (ref 150–400)
Platelets: 238 10*3/uL (ref 150–400)
RBC: 3.25 MIL/uL — ABNORMAL LOW (ref 3.87–5.11)
RBC: 3.1 MIL/uL — ABNORMAL LOW (ref 3.87–5.11)
RDW: 18.3 % — ABNORMAL HIGH (ref 11.5–15.5)
RDW: 18.6 % — ABNORMAL HIGH (ref 11.5–15.5)
WBC: 5.2 10*3/uL (ref 4.0–10.5)
WBC: 5.8 10*3/uL (ref 4.0–10.5)
nRBC: 0.6 % — ABNORMAL HIGH (ref 0.0–0.2)
nRBC: 0.5 % — ABNORMAL HIGH (ref 0.0–0.2)

## 2019-07-04 LAB — FERRITIN: Ferritin: 20 ng/mL (ref 11–307)

## 2019-07-04 LAB — SARS CORONAVIRUS 2 (TAT 6-24 HRS): SARS Coronavirus 2: NEGATIVE

## 2019-07-04 LAB — IRON AND TIBC
Iron: 8 ug/dL — ABNORMAL LOW (ref 28–170)
Saturation Ratios: 2 % — ABNORMAL LOW (ref 10.4–31.8)
TIBC: 323 ug/dL (ref 250–450)
UIBC: 315 ug/dL

## 2019-07-04 LAB — TSH: TSH: 1.045 u[IU]/mL (ref 0.350–4.500)

## 2019-07-04 MED ORDER — POLYVINYL ALCOHOL 1.4 % OP SOLN: 1.0000 [drp] | OPHTHALMIC | Status: DC | PRN

## 2019-07-04 MED ORDER — FERROUS SULFATE 325 (65 FE) MG PO TBEC: 325.0000 mg | DELAYED_RELEASE_TABLET | Freq: Two times a day (BID) | ORAL | 3 refills | Status: AC

## 2019-07-04 MED ORDER — PANTOPRAZOLE SODIUM 40 MG PO TBEC: 40.0000 mg | DELAYED_RELEASE_TABLET | Freq: Two times a day (BID) | ORAL | 1 refills | Status: AC

## 2019-07-04 MED ORDER — ACETAMINOPHEN 325 MG PO TABS: 650.0000 mg | ORAL_TABLET | Freq: Four times a day (QID) | ORAL | 0 refills | Status: AC | PRN

## 2019-07-04 NOTE — Consult Note (Signed)
Referring Provider: Dr. Jani Gravel  Primary Care Physician:  Renee Rival, NP Primary Gastroenterologist:  Dr. Oneida Alar   Date of Admission: 07/03/2019 Date of Consultation: 07/04/2019  Reason for Consultation:  Anemia   HPI:  Raven Moore is a 85 y.o. year old female with known IDA, last seen Dec 2019 during hospitalization for profound anemia.  Colonoscopy/EGD completed last year and found to have ulcerated non-obstructing medium-sized mass at splenic flexure, partially circumferential with oozing, s/p biopsy and tattoo, segmental mild inflammation with erosions, erythema, mucosa in descending colon s/p biopsy, sigmoid with continuous area of non-bleeding ulcerated mucosa in sigmoid. Path with ischemia. Recommended CTA but did not pursue. Presented this admission with Hgb 5.6. Received 2 units PRBCs with improvement in Hgb to 9.   Patient is very hard of hearing and history of dementia. Due to masks, unable to hear me well. I contacted daughter and spoke with her via phone. Daughter lives with patient. Patient had been having symptoms concerning to daughter that anemia may be worsening (worsening weakness, worsening confusion from baseline with known dementia). Labs completed as outpatient on Friday and recommended presentation to ED. Daughter wanted to hold off on intervention until after the weekend whereby she could possibly have blood transfusion as outpatient. She then realized this was not possible and brought patient to the ED.   No overt GI bleeding. Chronically on oral iron and has had dark stool with this. Appetite varies. Sometimes small amounts. Last Tuesday had abdominal pain but then had multiple BMs with relief of abdominal pain. Will take stool softeners routinely to avoid constipation. No abdominal pain since constipation relieved. No dysphagia or GERD. Omeprazole daily. Will occasionally have tylenol for headaches. Blood work followed quarterly. Earlier this month, Hgb around  the 8 range per daughter. Recommended to continue iron with Vit C per PCP.   Daughter desires non-invasive support. Willing to pursue blood transfusions, serial blood work, but does not want to pursue any imaging or endoscopy procedures. She desires quality of life for her mother and if hospice is necessary in the future, she is agreeable to this.    Past Medical History:  Diagnosis Date  . Atrial fibrillation ([place])   . Breast cancer ([place]) 1982   On the right.  . DJD (degenerative joint disease)   . High cholesterol   . Hypertension   . Hypothyroidism 06/06/2012    Past Surgical History:  Procedure Laterality Date  . ABDOMINAL HYSTERECTOMY     For fibroid tumors.  . Bilateral total hip replacements    . BIOPSY  06/20/2018   Procedure: BIOPSY;  Surgeon: Danie Binder, MD;  Location: [facility];  Service: Endoscopy;;  . BREAST SURGERY    . COLONOSCOPY WITH PROPOFOL N/A 06/20/2018    ulcerated non-obstructing medium-sized mass at splenic flexure, partially circumferential with oozing, s/p biopsy and tattoo, segmental mild inflammation with erosions, erythema, mucosa in descending colon s/p biopsy, sigmoid with continuous area of non-bleeding ulcerated mucosa in sigmoid. Path with ischemia. Recommended CTA but did not pursue.   . ESOPHAGOGASTRODUODENOSCOPY (EGD) WITH PROPOFOL N/A 06/20/2018    large hiatal hernia, mild gastritis s/p biopsy  . Right-sided mastectomy  1982    Prior to Admission medications   Medication Sig Start Date End Date Taking? Authorizing Provider  carboxymethylcellulose (REFRESH PLUS) 0.5 % SOLN Place 1 drop into both eyes 3 (three) times daily as needed (for dry eye relief).    Yes [provider]  ferrous sulfate  325 (65 FE) MG EC tablet Take 325 mg by mouth daily.   Yes [provider]  fish oil-omega-3 fatty acids 1000 MG capsule Take 1 g by mouth daily.   Yes [provider]  gabapentin (NEURONTIN) 100 MG capsule Take 200 mg by  mouth 2 (two) times daily.    Yes [provider]  levothyroxine (SYNTHROID, LEVOTHROID) 75 MCG tablet Take 75 mcg by mouth every morning.    Yes [provider]  omeprazole (PRILOSEC) 20 MG capsule Take 20 mg by mouth every evening.    Yes [provider]  PARoxetine (PAXIL) 30 MG tablet Take 30 mg by mouth daily.    Yes [provider]  senna-docusate (SENOKOT-S) 8.6-50 MG tablet Take 2 tablets by mouth 2 (two) times daily.   Yes [provider]  furosemide (LASIX) 40 MG tablet Take 2 tablets (80 mg total) by mouth daily. Patient not taking: Reported on 07/03/2019 06/20/18   Kathie Dike, MD  Potassium Chloride ER 20 MEQ TBCR Take 20 mEq by mouth daily. Patient not taking: Reported on 07/03/2019 01/21/18   Daleen Bo, MD    Current Facility-Administered Medications  Medication Dose Route Frequency Provider Last Rate Last Admin  . acetaminophen (TYLENOL) tablet 650 mg  650 mg Oral Q6H PRN Jani Gravel, MD       Or  . acetaminophen (TYLENOL) suppository 650 mg  650 mg Rectal Q6H PRN Jani Gravel, MD      . gabapentin (NEURONTIN) capsule 200 mg  200 mg Oral BID Jani Gravel, MD   200 mg at 07/04/19 0859  . levothyroxine (SYNTHROID) tablet 75 mcg  75 mcg Oral q morning - 10a Jani Gravel, MD   75 mcg at 07/04/19 0526  . pantoprazole (PROTONIX) 80 mg in sodium chloride 0.9 % 250 mL (0.32 mg/mL) infusion  8 mg/hr Intravenous Continuous Petrucelli, Samantha R, PA-C 25 mL/hr at 07/03/19 2149 8 mg/hr at 07/03/19 2149  . PARoxetine (PAXIL) tablet 30 mg  30 mg Oral Daily Jani Gravel, MD   30 mg at 07/04/19 1006  . polyvinyl alcohol (LIQUIFILM TEARS) 1.4 % ophthalmic solution 1 drop  1 drop Both Eyes PRN Emokpae, Courage, MD      . senna-docusate (Senokot-S) tablet 2 tablet  2 tablet Oral BID Jani Gravel, MD   2 tablet at 07/04/19 [id number]    Allergies as of 07/03/2019 - Review Complete 07/03/2019  Allergen Reaction Noted  . Novocain [procaine hcl] Rash 06/06/2012     Family History  Problem Relation Age of Onset  . Colon cancer Neg Hx   . Colon polyps Neg Hx     Social History   Socioeconomic History  . Marital status: Widowed    Spouse name: Not on file  . Number of children: Not on file  . Years of education: Not on file  . Highest education level: Not on file  Occupational History  . Not on file  Tobacco Use  . Smoking status: Never Smoker  . Smokeless tobacco: Never Used  Substance and Sexual Activity  . Alcohol use: No  . Drug use: No  . Sexual activity: Never  Other Topics Concern  . Not on file  Social History Narrative   WIDOWED. UNTILA FEW YEARS AGO WAS TAKING CARE OF HERSELF. 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 1 CHILD: DAUGHTER WHO LIVED IN [facility] AND MOVED BACK HOME TO TAKE CARE OF MOM. MOM HAS HER OWN APARTMENT.   Social Determinants of Health  Financial Resource Strain:   . Difficulty of Paying Living Expenses: Not on file  Food Insecurity:   . Worried About Charity fundraiser in the Last Year: Not on file  . Ran Out of Food in the Last Year: Not on file  Transportation Needs:   . Lack of Transportation (Medical): Not on file  . Lack of Transportation (Non-Medical): Not on file  Physical Activity:   . Days of Exercise per Week: Not on file  . Minutes of Exercise per Session: Not on file  Stress:   . Feeling of Stress : Not on file  Social Connections:   . Frequency of Communication with Friends and Family: Not on file  . Frequency of Social Gatherings with Friends and Family: Not on file  . Attends Religious Services: Not on file  . Active Member of Clubs or Organizations: Not on file  . Attends Archivist Meetings: Not on file  . Marital Status: Not on file  Intimate Partner Violence:   . Fear of Current or Ex-Partner: Not on file  . Emotionally Abused: Not on file  . Physically Abused: Not on file  . Sexually Abused: Not on file    Review of Systems: Limited due to cognitive  status.  Physical Exam: Vital signs in last 24 hours: Temp:  [97.9 F (36.6 C)-99.2 F (37.3 C)] 98.9 F (37.2 C) (12/21 0525) Pulse Rate:  [53-73] 58 (12/21 0525) Resp:  [15-22] 18 (12/21 0525) BP: (106-143)/(52-80) 111/59 (12/21 0525) SpO2:  [90 %-100 %] 90 % (12/21 0525) Weight:  [53.3 kg-56.7 kg] 53.3 kg (12/20 2342)   General:   Alert, frail, appears stated age, no distress Head:  Normocephalic and atraumatic. Eyes:  Sclera clear, no icterus.   Conjunctiva pink. Ears:  Normal auditory acuity. Lungs:  Clear throughout to auscultation.    Heart:  S1 S2 present, no obvious murmurs Abdomen:  Soft, nontender and nondistended. No masses, hepatosplenomegaly or hernias noted. Normal bowel sounds, without guarding, and without rebound.   Rectal:  Deferred  Msk:  Symmetrical without gross deformities. Extremities:  Without edema. Neurologic:  Alert and oriented to person Psych:  Alert and cooperative. Normal mood and affect.  Intake/Output from previous day: 12/20 0701 - 12/21 0700 In: 945 [I.V.:35; Blood:810; IV Piggyback:100] Out: -  Intake/Output this shift: No intake/output data recorded.  Lab Results: Recent Labs    07/01/19 1300 07/03/19 2040 07/04/19 0456  WBC 5.4 4.8 5.2  HGB 6.3* 5.6* 9.0*  HCT 21.7* 19.9* 30.2*  PLT 376 292 276   BMET Recent Labs    07/01/19 1300 07/03/19 2040 07/04/19 0456  NA 134* 137 138  K 3.7 3.7 3.4*  CL 98 106 103  CO2 27 25 24   GLUCOSE 107* 97 85  BUN 38* 24* 20  CREATININE 0.98 0.87 0.82  CALCIUM 9.4 9.0 9.5   LFT Recent Labs    07/01/19 1300 07/03/19 2040 07/04/19 0456  PROT 6.5 6.2* 6.3*  ALBUMIN 2.9* 2.7* 2.8*  AST 20 17 18   ALT 12 11 11   ALKPHOS 45 45 44  BILITOT 0.3 0.5 0.7   PT/INR Recent Labs    07/03/19 2040  LABPROT 14.2  INR 1.1     Impression/Plan: 85 year old female with history of profound IDA a year ago s/p colonoscopy/EGD with findings of ischemia on path from colonoscopy, presenting with  acute on chronic anemia after blood drawn by PCP on Friday. Stool is heme positive but without any overt GI  bleeding. Received 2 units of PRBCs with Hgb improving from 5.6 to 9. Discussed with daughter, who continues to decline any invasive interventions, endoscopy, or CT scans (previously recommending CTA last year), as she wants to focus on quality of life. She is agreeable to transfusions, IV infusions, oral iron therapy, and serial monitoring of CBC as outpatient. Daughter has also clearly stated that if hospice is recommended in the future, she would be willing to discuss this route.  Recommend following CBC in more frequent intervals as outpatient. No need for endoscopic evaluation. Will sign off. Hopefully, patient can be discharged later today.    Annitta Needs, PhD, ANP-BC [facility]     LOS: 0 days    07/04/2019, 12:28 PM

## 2019-07-04 NOTE — Progress Notes (Signed)
Home health Choices;  [facility] [phone] Quality rating Patient survey rating 2. [facility] [phone] Quality rating Patient survey rating 3. [facility] [phone] Quality rating Patient survey rating 4. [facility] [phone] Quality rating Patient survey rating 5. [facility] [phone] Quality rating Patient survey rating 6. [facility] [phone] Quality rating Patient survey rating 7. [facility] [phone] Quality rating Patient survey rating 8. [facility] [phone] Quality rating Patient survey rating 9. [facility] [phone] Quality rating Patient survey rating 11 10. [facility] [phone] Quality rating Patient survey rating 11. [facility] [phone] Quality rating Patient survey rating 12. [facility] [phone] Quality rating Patient survey rating 13. [facility] [phone] Quality rating Patient survey rating Not available11 14. [facility] [phone] Quality rating Patient survey rating 

## 2019-07-04 NOTE — Progress Notes (Signed)
Patient's daughter states understanding of discharge instructions 

## 2019-07-04 NOTE — TOC Initial Note (Signed)
Transition of Care [facility]) - Initial/Assessment Note    Patient Details  Name: Raven Moore MRN: [id number] Date of Birth: 05/03/1924  [facility]) CM/SW Contact:    Boneta Lucks, RN Phone Number: 07/04/2019, 1:05 PM  Clinical Narrative:     Patient admitted in observation for anemia. Discharge planning is to go back to her son house tomorrow. MD is ordering Home Health PT. Patient has used [facility] in the past. Orma Render, She accepted the referral.              Expected Discharge Plan: [place] Barriers to Discharge: Continued Medical Work up   Patient Goals and CMS Choice Patient states their goals for this hospitalization and ongoing recovery are:: to go home with son. CMS Medicare.gov Compare Post Acute Care list provided to:: Patient Choice offered to / list presented to : Patient  Expected Discharge Plan and Services Expected Discharge Plan: [place]       Living arrangements for the past 2 months: Single Family Home                           HH Arranged: PT HH Agency: [facility] Date [place]: 07/04/19 Time HH Agency Contacted: 72 Representative spoke with at [place]: [place] Arrangements/Services Living arrangements for the past 2 months: [place] with:: Adult Children          Need for Family Participation in Patient Care: Yes (Comment) Care giver support system in place?: Yes (comment)   Criminal Activity/Legal Involvement Pertinent to Current Situation/Hospitalization: No - Comment as needed  Activities of Daily Living Home Assistive Devices/Equipment: Wheelchair, Environmental consultant (specify type), Eyeglasses, Hearing aid ADL Screening (condition at time of admission) Patient's cognitive ability adequate to safely complete daily activities?: No Is the patient deaf or have difficulty hearing?: Yes Does the patient have difficulty seeing, even when  wearing glasses/contacts?: No Does the patient have difficulty concentrating, remembering, or making decisions?: Yes Patient able to express need for assistance with ADLs?: Yes Does the patient have difficulty dressing or bathing?: Yes Independently performs ADLs?: No Communication: Independent Dressing (OT): Needs assistance Is this a change from baseline?: Pre-admission baseline Grooming: Needs assistance Is this a change from baseline?: Pre-admission baseline Feeding: Independent Bathing: Needs assistance Is this a change from baseline?: Pre-admission baseline Toileting: Needs assistance Is this a change from baseline?: Pre-admission baseline In/Out Bed: Needs assistance Is this a change from baseline?: Pre-admission baseline Walks in Home: Needs assistance Is this a change from baseline?: Pre-admission baseline Does the patient have difficulty walking or climbing stairs?: Yes Weakness of Legs: Both Weakness of Arms/Hands: Both  Permission Sought/Granted       Emotional Assessment      Alcohol / Substance Use: Other (comment) Psych Involvement: No (comment)  Admission diagnosis:  Symptomatic anemia [D64.9] Gastrointestinal hemorrhage, unspecified gastrointestinal hemorrhage type [K92.2] Anemia, unspecified type [D64.9] Patient Active Problem List   Diagnosis Date Noted  . Protein-calorie malnutrition, severe ([place]) 07/03/2019  . Atrial fibrillation, chronic ([place]) 06/19/2018  . Symptomatic anemia 06/18/2018  . Heme positive stool 06/18/2018  . CAP (community acquired pneumonia) 06/06/2012  . Anemia 06/06/2012  . Dehydration 06/06/2012  . HTN (hypertension) 06/06/2012  . Hypothyroidism 06/06/2012   PCP:  Renee Rival, NP Pharmacy:   [facility], [address]  [place] [address] Phone: [phone] Fax: [phone]   Readmission Risk Interventions No flowsheet data found.

## 2019-07-04 NOTE — Discharge Summary (Signed)
Raven Moore, is a 85 y.o. female  DOB 1924/06/13  MRN [id number].  Admission date:  07/03/2019  Admitting Physician  Jani Gravel, MD  Discharge Date:  07/04/2019   Primary MD  Renee Rival, NP  Recommendations for primary care physician for things to follow:   1)Avoid ibuprofen/Advil/Aleve/Motrin/Goody Powders/Naproxen/BC powders/Meloxicam/Diclofenac/Indomethacin and other Nonsteroidal anti-inflammatory medications as these will make you more likely to bleed and can cause stomach ulcers, can also cause Kidney problems.   2)Repeat CBC blood draw on Thursday, 07/07/2019  Admission Diagnosis  Symptomatic anemia [D64.9] Gastrointestinal hemorrhage, unspecified gastrointestinal hemorrhage type [K92.2] Anemia, unspecified type [D64.9]   Discharge Diagnosis  Symptomatic anemia [D64.9] Gastrointestinal hemorrhage, unspecified gastrointestinal hemorrhage type [K92.2] Anemia, unspecified type [D64.9]    Active Problems:   Anemia   HTN (hypertension)   Hypothyroidism   Symptomatic anemia   Atrial fibrillation, chronic (HCC)   Protein-calorie malnutrition, severe (HCC)      Past Medical History:  Diagnosis Date  . Atrial fibrillation ([place])   . Breast cancer ([place]) 1982   On the right.  . DJD (degenerative joint disease)   . High cholesterol   . Hypertension   . Hypothyroidism 06/06/2012    Past Surgical History:  Procedure Laterality Date  . ABDOMINAL HYSTERECTOMY     For fibroid tumors.  . Bilateral total hip replacements    . BIOPSY  06/20/2018   Procedure: BIOPSY;  Surgeon: Danie Binder, MD;  Location: [facility];  Service: Endoscopy;;  . BREAST SURGERY    . COLONOSCOPY WITH PROPOFOL N/A 06/20/2018    ulcerated non-obstructing medium-sized mass at splenic flexure, partially circumferential with oozing, s/p biopsy and tattoo, segmental mild inflammation with erosions, erythema, mucosa  in descending colon s/p biopsy, sigmoid with continuous area of non-bleeding ulcerated mucosa in sigmoid. Path with ischemia. Recommended CTA but did not pursue.   . ESOPHAGOGASTRODUODENOSCOPY (EGD) WITH PROPOFOL N/A 06/20/2018    large hiatal hernia, mild gastritis s/p biopsy  . Right-sided mastectomy  1982       HPI  from the history and physical done on the day of admission:    Raven Moore  is a 85 y.o. female,  w hypothyroidism, h/o right sided breast cancer,  hypertension, Pafib, Iron deficiency anemia, w prior admission 06/18/2018 for anemia, w heme positive stool, was told by PCP to go to ER for low Hgb <6.  Pt stopped Eliquis about 1 week ago. Pt denies n/v, hematemesis, abd pain, diarrhea, brbpr.  + dizziness with walking.   EGD 06/20/2018-> mild gastritis, Colonoscopy 06/20/2018 -> ulcerated non-obstructing medium -sized mass at the splenic flexure.  (mucosal necrosis)   Daughter her POA is not in favor of endoscopy at this time as long as Hgb stable.   In ED,  T 99.2, P 73  R 20, Bp 121/77 pox 91% on RA Wt 56.7kg  Na 137, K 3.7, Bun 24, Creatinine 0.87 Alb 2.7 Ast 17, Alt 11 Wbc 4.8, Hgb 5.6, Plt 292 FOBT positive INR 1.1  2 units prbc transfusing currently.  protonix 80mg  iv x1, then 8mg / hr started by ED.   Pt will be admitted for symptomatic anemia, w heme positive stool.   Hospital Course:   Brief Summary:-  85 y.o. female,  w hypothyroidism, h/o right sided breast cancer,  hypertension, Pafib, Iron deficiency anemia, w prior admission 06/18/2018 for anemia, w heme positive stool, was told by PCP to go to ER for low Hgb <6.  Pt stopped Eliquis about 1 week ago. Pt denies n/v, hematemesis, abd pain, diarrhea, brbpr.    A/p H/o Iron Deficiency Anemia--- EGD 06/20/2018-> mild gastritis, Colonoscopy 06/20/2018 -> ulcerated non-obstructing medium -sized mass at the splenic flexure.  (mucosal necrosis) -Discharge hemoglobin 8.5, repeat CBC on 07/07/2019 advised As per  [facility] following CBC in more frequent intervals as outpatient. No need for endoscopic evaluation  Chronic Afib--off Eliquis PTA due to concerns about GI bleed  Hypothyroidism--continue levothyroxine  Anxiety--continue Paxil  Discharge Condition: stable  Follow UP--- Repeat CBC 07/07/19   Consults obtained - Discussed with Gi   Diet and Activity recommendation:  As advised  Discharge Instructions   Discharge Instructions    Call MD for:  difficulty breathing, headache or visual disturbances   Complete by: As directed    Call MD for:  persistant dizziness or light-headedness   Complete by: As directed    Call MD for:  persistant nausea and vomiting   Complete by: As directed    Call MD for:  severe uncontrolled pain   Complete by: As directed    Call MD for:  temperature >100.4   Complete by: As directed    Diet - low sodium heart healthy   Complete by: As directed    Discharge instructions   Complete by: As directed    1)Avoid ibuprofen/Advil/Aleve/Motrin/Goody Powders/Naproxen/BC powders/Meloxicam/Diclofenac/Indomethacin and other Nonsteroidal anti-inflammatory medications as these will make you more likely to bleed and can cause stomach ulcers, can also cause Kidney problems.   2) repeat CBC blood draw on Thursday, 07/07/2019   Increase activity slowly   Complete by: As directed         Discharge Medications     Allergies as of 07/04/2019      Reactions   Novocain [procaine Hcl] Rash      Medication List    STOP taking these medications   furosemide 40 MG tablet Commonly known as: LASIX   omeprazole 20 MG capsule Commonly known as: PRILOSEC   Potassium Chloride ER 20 MEQ Tbcr     TAKE these medications   acetaminophen 325 MG tablet Commonly known as: TYLENOL Take 2 tablets (650 mg total) by mouth every 6 (six) hours as needed for mild pain or headache (or Fever >/= 101).   carboxymethylcellulose 0.5 % Soln Commonly known as: REFRESH  PLUS Place 1 drop into both eyes 3 (three) times daily as needed (for dry eye relief).   ferrous sulfate 325 (65 FE) MG EC tablet Take 1 tablet (325 mg total) by mouth 2 (two) times daily. What changed: when to take this   fish oil-omega-3 fatty acids 1000 MG capsule Take 1 g by mouth daily.   gabapentin 100 MG capsule Commonly known as: NEURONTIN Take 200 mg by mouth 2 (two) times daily.   levothyroxine 75 MCG tablet Commonly known as: SYNTHROID Take 75 mcg by mouth every morning.   pantoprazole 40 MG tablet Commonly known as: Protonix Take 1 tablet (40 mg total) by mouth 2 (two) times  daily before a meal.   PARoxetine 30 MG tablet Commonly known as: PAXIL Take 30 mg by mouth daily.   senna-docusate 8.6-50 MG tablet Commonly known as: Senokot-S Take 2 tablets by mouth 2 (two) times daily.      Major procedures and Radiology Reports - PLEASE review detailed and final reports for all details, in brief -   Micro Results   Recent Results (from the past 240 hour(s))  Culture, Urine     Status: Abnormal   Collection Time: 07/01/19 12:30 PM   Specimen: Urine, Clean Catch  Result Value Ref Range Status   Specimen Description   Final    URINE, CLEAN CATCH Performed at [facility], [address] [place], [place] [address]    Special Requests   Final    NONE Performed at [facility], [address] [place], [place] [address]    Culture (A)  Final    <10,000 COLONIES/mL INSIGNIFICANT GROWTH Performed at [facility], [place] [address], [place], [place] [address]    Report Status 07/02/2019 FINAL  Final  SARS CORONAVIRUS 2 (TAT 6-24 HRS) Nasopharyngeal Nasopharyngeal Swab     Status: None   Collection Time: 07/03/19 10:00 PM   Specimen: Nasopharyngeal Swab  Result Value Ref Range Status   SARS Coronavirus 2 NEGATIVE NEGATIVE Final    Comment: (NOTE) SARS-CoV-2 target nucleic acids are NOT DETECTED. The SARS-CoV-2 RNA is generally detectable in upper and  lower respiratory specimens during the acute phase of infection. Negative results do not preclude SARS-CoV-2 infection, do not rule out co-infections with other pathogens, and should not be used as the sole basis for treatment or other patient management decisions. Negative results must be combined with clinical observations, patient history, and epidemiological information. The expected result is Negative. Fact Sheet for Patients: SugarRoll.be Fact Sheet for Healthcare Providers: https://www.woods-mathews.com/ This test is not yet approved or cleared by the [place] FDA and  has been authorized for detection and/or diagnosis of SARS-CoV-2 by FDA under an Emergency Use Authorization (EUA). This EUA will remain  in effect (meaning this test can be used) for the duration of the COVID-19 declaration under Section 56 4(b)(1) of the Act, 21 U.S.C. section 360bbb-3(b)(1), unless the authorization is terminated or revoked sooner. Performed at [facility], [place] [address], [place], [place] [address]        Today   Subjective    Raven Moore today has no new concerns, no dizziness, no chest pains no shortness of breath        Patient has been seen and examined prior to discharge   Objective   Blood pressure 103/61, pulse (!) 53, temperature 97.6 F (36.4 C), temperature source Oral, resp. rate 20, height 5\' 5"  (1.651 m), weight 53.3 kg, last menstrual period 06/06/2012, SpO2 90 %.   Intake/Output Summary (Last 24 hours) at 07/04/2019 1501 Last data filed at 07/04/2019 0515 Gross per 24 hour  Intake 945.03 ml  Output --  Net 945.03 ml    Exam Gen:- Awake Alert, no acute distress  HEENT:- [place].AT, No sclera icterus Neck-Supple Neck,No JVD,.  Lungs-  CTAB , good air movement bilaterally  CV- S1, S2 normal, regular Abd-  +ve B.Sounds, Abd Soft, No tenderness,    Extremity/Skin:- No  edema,   good pulses Psych-affect is  appropriate, oriented x3 Neuro-no new focal deficits, no tremors    Data Review   CBC w Diff:  Lab Results  Component Value Date  WBC 5.8 07/04/2019   HGB 8.5 (L) 07/04/2019   HCT 29.0 (L) 07/04/2019   PLT 238 07/04/2019   LYMPHOPCT 12 07/03/2019   MONOPCT 15 07/03/2019   EOSPCT 1 07/03/2019   BASOPCT 0 07/03/2019    CMP:  Lab Results  Component Value Date   NA 138 07/04/2019   K 3.4 (L) 07/04/2019   CL 103 07/04/2019   CO2 24 07/04/2019   BUN 20 07/04/2019   CREATININE 0.82 07/04/2019   PROT 6.3 (L) 07/04/2019   ALBUMIN 2.8 (L) 07/04/2019   BILITOT 0.7 07/04/2019   ALKPHOS 44 07/04/2019   AST 18 07/04/2019   ALT 11 07/04/2019  .   Total Discharge time is about 33 minutes  Roxan Hockey M.D on 07/04/2019 at 3:01 PM  Go to [email or web address] -  for contact info  [facility] - Office  [phone]

## 2019-07-04 NOTE — Discharge Instructions (Signed)
1)Avoid ibuprofen/Advil/Aleve/Motrin/Goody Powders/Naproxen/BC powders/Meloxicam/Diclofenac/Indomethacin and other Nonsteroidal anti-inflammatory medications as these will make you more likely to bleed and can cause stomach ulcers, can also cause Kidney problems.   2) repeat CBC blood draw on Thursday, 07/07/2019

## 2019-07-05 LAB — TYPE AND SCREEN
ABO/RH(D): O POS
Antibody Screen: NEGATIVE
Unit division: 0
Unit division: 0

## 2019-07-05 LAB — BPAM RBC
Blood Product Expiration Date: 202101242359
Blood Product Expiration Date: 202101272359
ISSUE DATE / TIME: 202012202203
ISSUE DATE / TIME: 202012210111
Unit Type and Rh: 5100
Unit Type and Rh: 5100

## 2019-07-05 LAB — FOLATE RBC
Folate, Hemolysate: 295 ng/mL
Folate, RBC: 993 ng/mL (ref >498)
Hematocrit: 29.7 % — ABNORMAL LOW (ref 34.0–46.6)

## 2019-07-15 DEATH — deceased

## 2019-11-10 IMAGING — DX DG ABDOMEN ACUTE W/ 1V CHEST
3 series · 3 of 3 positions shown · non-contrast
Comparison: Chest radiography and chest CT 01/21/2018..

CLINICAL DATA: Abdominal pain with multiple bowel movements today.
Dementia.

EXAM:
DG ABDOMEN ACUTE W/ 1V CHEST

[abdomen erect]
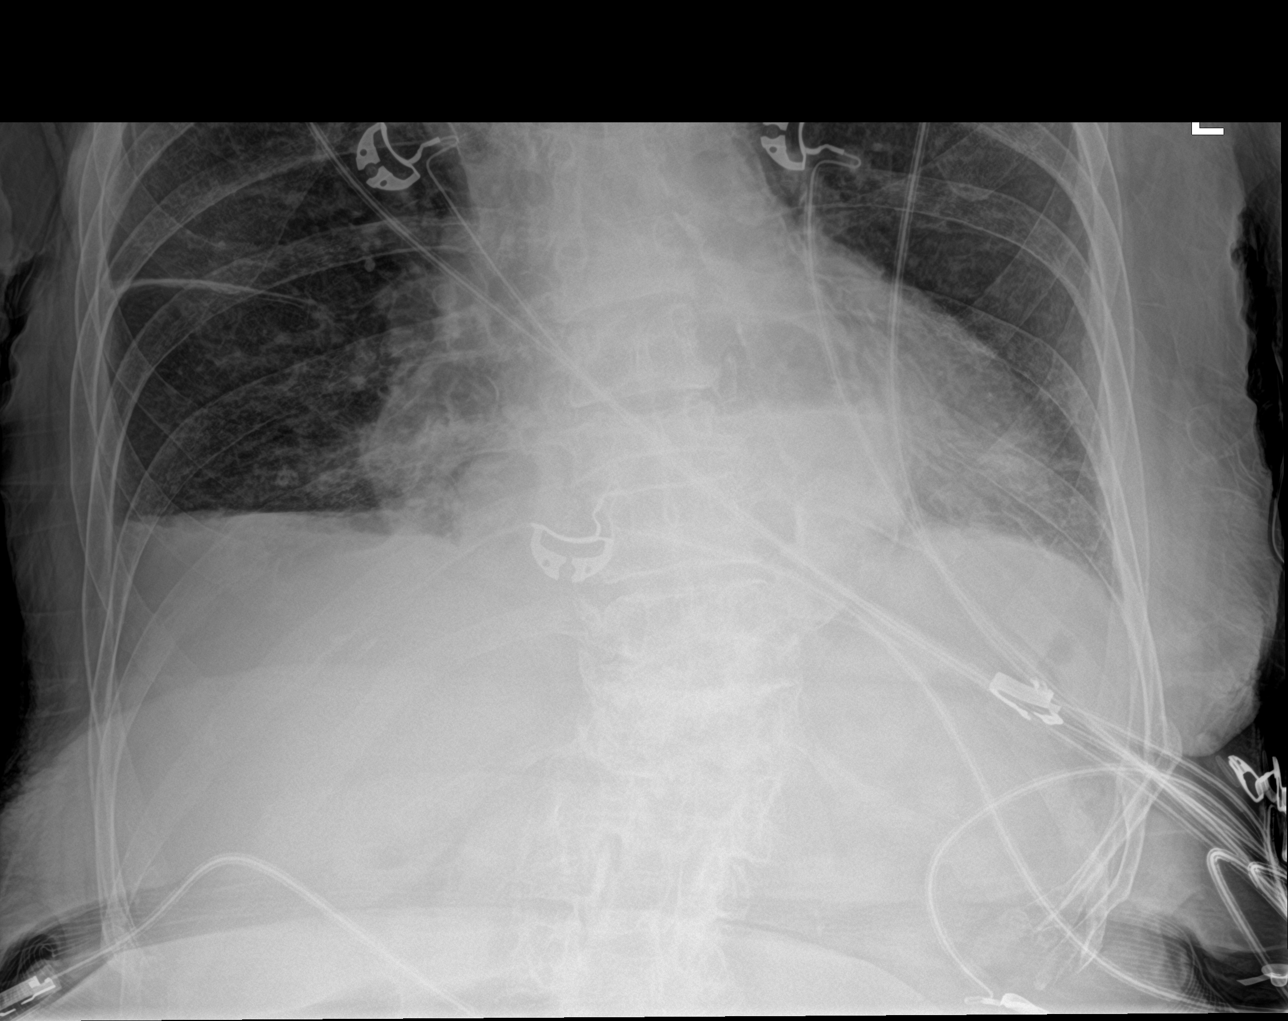

[abdomen supine]
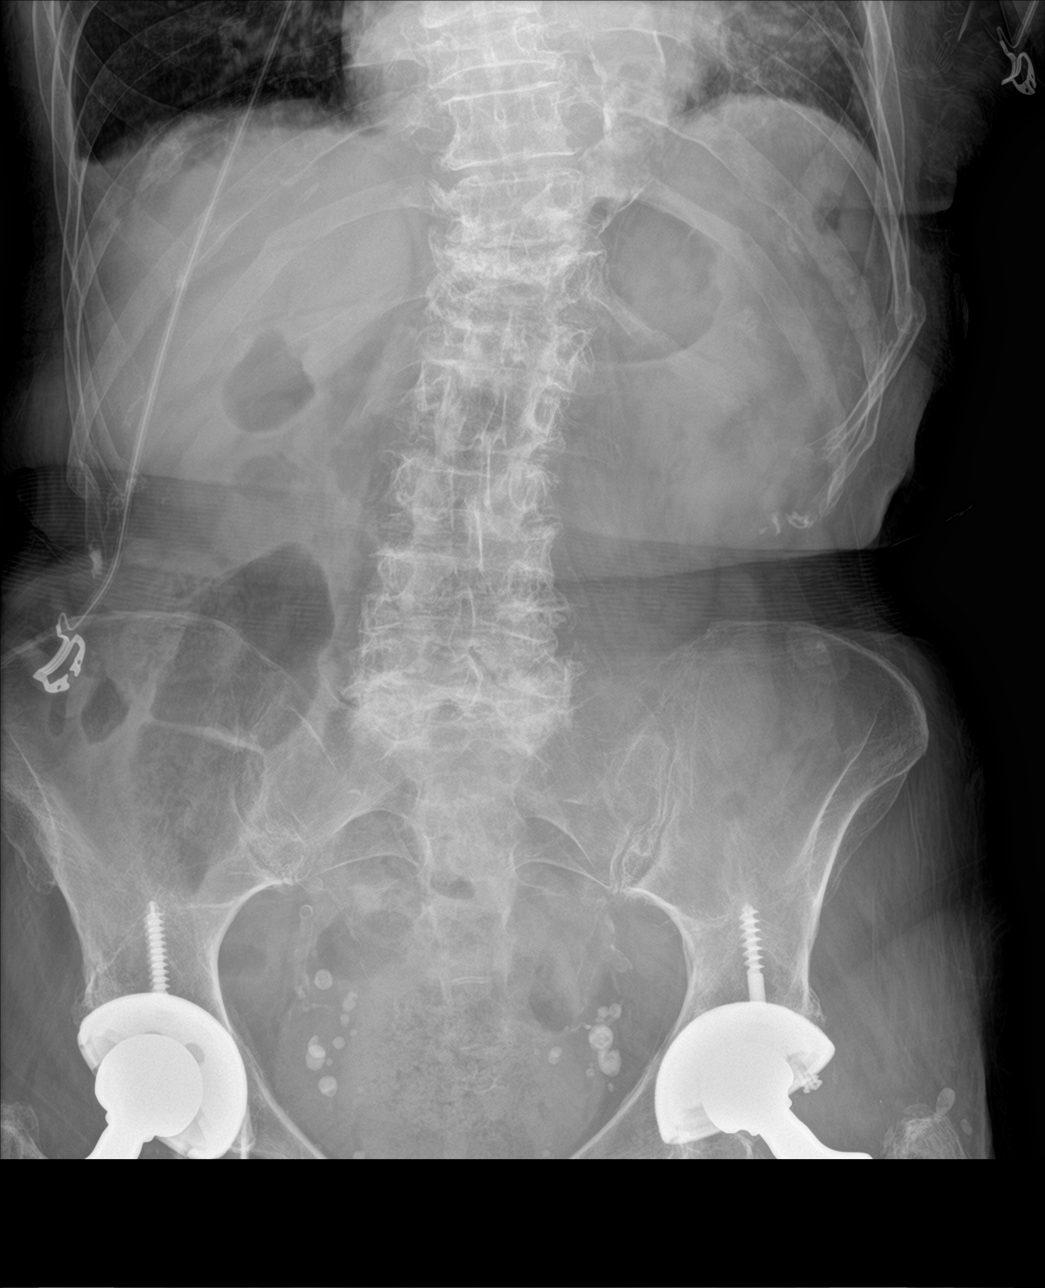

[chest ap]
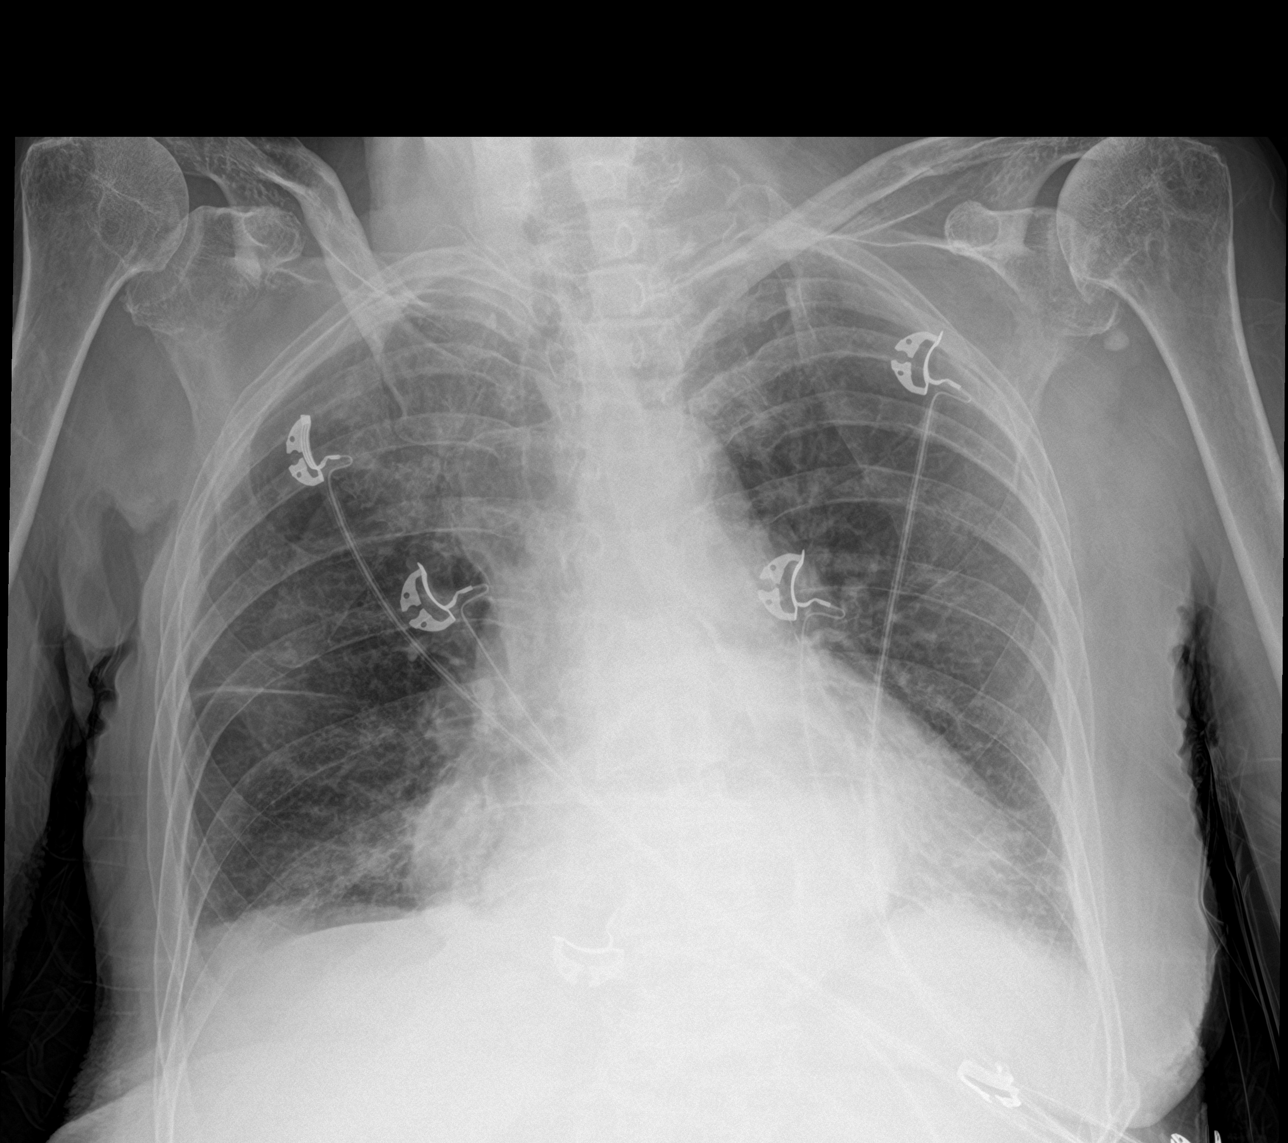

[3 of 3 positions shown; findings below may reference images not displayed]

FINDINGS: Bowel gas pattern does not show evidence of ileus or obstruction.
There is a large hiatal hernia. Moderate amount of fecal matter in
the rectum. No abnormal soft tissue calcifications of significance.
Old thoracolumbar compression fractures. Bilateral hip replacements.

One-view chest shows cardiomegaly and aortic atherosclerosis. Large
hiatal hernia as noted above. Chronic interstitial lung markings. No
active process.
IMPRESSION: Large hiatal hernia. Moderate amount of fecal matter in the rectum.
Otherwise unremarkable abdominal films.
